# Patient Record
Sex: Male | Born: 1965 | Race: White | Hispanic: No | Marital: Married | Smoking: Never smoker
Health system: Southern US, Community
[De-identification: ages and names within clinical notes are randomized; demographics above are authoritative.]

## PROBLEM LIST (undated history)

## (undated) DIAGNOSIS — G4730 Sleep apnea, unspecified: Secondary | ICD-10-CM

## (undated) DIAGNOSIS — K219 Gastro-esophageal reflux disease without esophagitis: Secondary | ICD-10-CM

## (undated) DIAGNOSIS — R45851 Suicidal ideations: Secondary | ICD-10-CM

## (undated) DIAGNOSIS — F32A Depression, unspecified: Secondary | ICD-10-CM

## (undated) DIAGNOSIS — I10 Essential (primary) hypertension: Secondary | ICD-10-CM

## (undated) DIAGNOSIS — F419 Anxiety disorder, unspecified: Secondary | ICD-10-CM

## (undated) DIAGNOSIS — F329 Major depressive disorder, single episode, unspecified: Secondary | ICD-10-CM

## (undated) DIAGNOSIS — E785 Hyperlipidemia, unspecified: Secondary | ICD-10-CM

## (undated) HISTORY — PX: HERNIA REPAIR: SHX51

## (undated) HISTORY — PX: SHOULDER ARTHROSCOPY: SHX128

---

## 2010-02-10 ENCOUNTER — Ambulatory Visit: Payer: Self-pay | Admitting: Diagnostic Radiology

## 2010-02-10 ENCOUNTER — Emergency Department (HOSPITAL_BASED_OUTPATIENT_CLINIC_OR_DEPARTMENT_OTHER): Admission: EM | Admit: 2010-02-10 | Discharge: 2010-02-10 | Payer: Self-pay | Admitting: Emergency Medicine

## 2010-07-04 LAB — CBC
MCV: 89.4 fL (ref 78.0–100.0)
Platelets: 241 10*3/uL (ref 150–400)
RBC: 4.99 MIL/uL (ref 4.22–5.81)
WBC: 11.2 10*3/uL — ABNORMAL HIGH (ref 4.0–10.5)

## 2010-07-04 LAB — POCT CARDIAC MARKERS
CKMB, poc: 1.2 ng/mL (ref 1.0–8.0)
Myoglobin, poc: 84.7 ng/mL (ref 12–200)
Troponin i, poc: <0.05 ng/mL (ref 0.00–0.09)

## 2010-07-04 LAB — URINALYSIS, ROUTINE W REFLEX MICROSCOPIC
Bilirubin Urine: NEGATIVE
Glucose, UA: NEGATIVE mg/dL
Hgb urine dipstick: NEGATIVE
Ketones, ur: NEGATIVE mg/dL
pH: 6 (ref 5.0–8.0)

## 2010-07-04 LAB — DIFFERENTIAL
Eosinophils Relative: 0 % (ref 0–5)
Lymphocytes Relative: 25 % (ref 12–46)
Lymphs Abs: 2.8 10*3/uL (ref 0.7–4.0)
Neutro Abs: 6.8 10*3/uL (ref 1.7–7.7)
Neutrophils Relative %: 61 % (ref 43–77)

## 2010-07-04 LAB — BASIC METABOLIC PANEL
Chloride: 100 mEq/L (ref 96–112)
Creatinine, Ser: 1 mg/dL (ref 0.4–1.5)
GFR calc Af Amer: >60 mL/min (ref >60)
Potassium: 3.5 mEq/L (ref 3.5–5.1)

## 2011-03-27 ENCOUNTER — Encounter: Payer: Self-pay | Admitting: Family Medicine

## 2011-03-27 ENCOUNTER — Emergency Department (HOSPITAL_BASED_OUTPATIENT_CLINIC_OR_DEPARTMENT_OTHER)
Admission: EM | Admit: 2011-03-27 | Discharge: 2011-03-27 | Disposition: A | Discharge status: Home / Self Care | Payer: 59 | Attending: Emergency Medicine | Admitting: Emergency Medicine

## 2011-03-27 ENCOUNTER — Emergency Department (INDEPENDENT_AMBULATORY_CARE_PROVIDER_SITE_OTHER): Payer: 59

## 2011-03-27 DIAGNOSIS — I10 Essential (primary) hypertension: Secondary | ICD-10-CM | POA: Insufficient documentation

## 2011-03-27 DIAGNOSIS — J189 Pneumonia, unspecified organism: Secondary | ICD-10-CM | POA: Insufficient documentation

## 2011-03-27 DIAGNOSIS — R05 Cough: Secondary | ICD-10-CM

## 2011-03-27 DIAGNOSIS — R059 Cough, unspecified: Secondary | ICD-10-CM

## 2011-03-27 DIAGNOSIS — IMO0001 Reserved for inherently not codable concepts without codable children: Secondary | ICD-10-CM | POA: Insufficient documentation

## 2011-03-27 DIAGNOSIS — R509 Fever, unspecified: Secondary | ICD-10-CM | POA: Insufficient documentation

## 2011-03-27 DIAGNOSIS — K219 Gastro-esophageal reflux disease without esophagitis: Secondary | ICD-10-CM | POA: Insufficient documentation

## 2011-03-27 HISTORY — DX: Essential (primary) hypertension: I10

## 2011-03-27 HISTORY — DX: Gastro-esophageal reflux disease without esophagitis: K21.9

## 2011-03-27 MED ORDER — AZITHROMYCIN 250 MG PO TABS: 250.0000 mg | ORAL_TABLET | Freq: Every day | ORAL | Status: AC

## 2011-03-27 MED ORDER — IBUPROFEN 800 MG PO TABS: 800.0000 mg | ORAL_TABLET | Freq: Three times a day (TID) | ORAL | Status: AC

## 2011-03-27 MED ORDER — AZITHROMYCIN 250 MG PO TABS: 250.0000 mg | ORAL_TABLET | Freq: Every day | ORAL | Status: DC

## 2011-03-27 MED ORDER — IBUPROFEN 800 MG PO TABS
800.0000 mg | ORAL_TABLET | Freq: Once | ORAL | Status: AC
  Administered 2011-03-27: 800 mg via ORAL
  Filled 2011-03-27: qty 1

## 2011-03-27 NOTE — ED Notes (Signed)
Pt c/o fever and vomiting since yesterday with headache.

## 2011-03-27 NOTE — ED Provider Notes (Signed)
History     CSN: [id number] Arrival date & time: 03/27/2011  3:47 PM   First MD Initiated Contact with Patient 03/27/11 1623      Chief Complaint  Patient presents with  . Fever    (Consider location/radiation/quality/duration/timing/severity/associated sxs/prior treatment) Patient is a 45 y.o. male presenting with fever. The history is provided by the patient. No language interpreter was used.  Fever Primary symptoms of the febrile illness include fever, cough and myalgias. Primary symptoms do not include dysuria. The current episode started yesterday. This is a new problem. The problem has not changed since onset. The cough began yesterday. The cough is new. The cough is productive. The sputum is yellow. It is exacerbated by exertion.  Associated with: nothing. Risk factors: nothing. Pt reports he began coughing yesterday.  Pt running a fever today.  (Wife reports pt started coughing up colored phelgm today. )  Pt has not had anything for fever today.  Pt had cough medication last pm.   Past Medical History  Diagnosis Date  . Hypertension   . Acid reflux     Past Surgical History  Procedure Date  . Shoulder arthroscopy   . Hernia repair     No family history on file.  History  Substance Use Topics  . Smoking status: Never Smoker   . Smokeless tobacco: Not on file  . Alcohol Use: No      Review of Systems  Constitutional: Positive for fever.  Respiratory: Positive for cough.   Genitourinary: Negative for dysuria.  Musculoskeletal: Positive for myalgias.  All other systems reviewed and are negative.    Allergies  Penicillins  Home Medications   Current Outpatient Rx  Name Route Sig Dispense Refill  . DEXTROMETHORPHAN POLISTIREX ER 30 MG/5ML PO LQCR Oral Take 60 mg by mouth as needed. For cough     . FLUOXETINE HCL 20 MG PO CAPS Oral Take 20 mg by mouth daily.      Marland Kitchen PROZAC PO Oral Take by mouth.      Marland Kitchen LISINOPRIL PO Oral Take by mouth.      . OMEPRAZOLE  MAGNESIUM 20 MG PO TBEC Oral Take 20 mg by mouth daily.      Marland Kitchen ZOFRAN PO Oral Take 1 tablet by mouth once.      Marland Kitchen PSEUDOEPH-DOXYLAMINE-DM-APAP 30-6.25-15-325 MG PO CAPS Oral Take 2 capsules by mouth every 6 (six) hours as needed. For congestion       BP 124/75  Pulse 86  Temp(Src) 101.6 F (38.7 C) (Oral)  Resp 16  Ht 5\' 8"  (1.727 m)  Wt 201 lb (91.173 kg)  BMI 30.56 kg/m2  SpO2 95%  Physical Exam  Nursing note and vitals reviewed. Constitutional: He is oriented to person, place, and time. He appears well-developed.  HENT:  Head: Normocephalic and atraumatic.  Right Ear: External ear normal.  Left Ear: External ear normal.  Nose: Nose normal.  Mouth/Throat: Oropharynx is clear and moist.  Eyes: Conjunctivae are normal. Pupils are equal, round, and reactive to light.  Neck: Normal range of motion.  Cardiovascular: Normal rate, regular rhythm and normal heart sounds.   Pulmonary/Chest: Effort normal and breath sounds normal.  Abdominal: Soft. Bowel sounds are normal.  Musculoskeletal: Normal range of motion.  Neurological: He is alert and oriented to person, place, and time.  Skin: Skin is warm.  Psychiatric: He has a normal mood and affect.    ED Course  Procedures (including critical care time)  Labs Reviewed -  No data to display Dg Chest 2 View  03/27/2011  *RADIOLOGY REPORT*  Clinical Data: Fever and cough  CHEST - 2 VIEW  Comparison: 02/10/2010  Findings: Heart size is normal.  No pleural effusion identified.  Asymmetric opacity within the left lower lobe is identified and is suspicious for early pneumonia.  The right lung is clear.  Chronic-appearing bronchitic changes are noted.  IMPRESSION:  1.  Subtle asymmetric left lower lobe opacities suspicious for early pneumonia.  Original Report Authenticated By: Rosealee Albee, M.D.     No diagnosis found.    MDM  Symptoms sound viral,  Chest xray obtained because wife mentioned colored sputum.  Pt given ibuprofen.  I  will treat with zithromax and ibuprofen.  This could be influenza however we are not testing for influenza and I will treat opacity as pneumonia.  Pt advised follow up with primary MD for recheck in 2-3 days.      Langston Masker, [place] 03/27/11 1709  Langston Masker, [place] 03/27/11 1758

## 2011-03-27 NOTE — ED Provider Notes (Signed)
Medical screening examination/treatment/procedure(s) were performed by non-physician practitioner and as supervising physician I was immediately available for consultation/collaboration.    Aries Kasa A. Zakeya Junker, MD 03/27/11 2312 

## 2013-10-10 ENCOUNTER — Emergency Department (HOSPITAL_BASED_OUTPATIENT_CLINIC_OR_DEPARTMENT_OTHER)
Admission: EM | Admit: 2013-10-10 | Discharge: 2013-10-10 | Disposition: A | Discharge status: Home / Self Care | Payer: 59 | Attending: Emergency Medicine | Admitting: Emergency Medicine

## 2013-10-10 ENCOUNTER — Encounter (HOSPITAL_BASED_OUTPATIENT_CLINIC_OR_DEPARTMENT_OTHER): Payer: Self-pay | Admitting: Emergency Medicine

## 2013-10-10 ENCOUNTER — Emergency Department (HOSPITAL_BASED_OUTPATIENT_CLINIC_OR_DEPARTMENT_OTHER): Payer: 59

## 2013-10-10 DIAGNOSIS — K219 Gastro-esophageal reflux disease without esophagitis: Secondary | ICD-10-CM | POA: Insufficient documentation

## 2013-10-10 DIAGNOSIS — Z880 Allergy status to penicillin: Secondary | ICD-10-CM | POA: Insufficient documentation

## 2013-10-10 DIAGNOSIS — K5289 Other specified noninfective gastroenteritis and colitis: Secondary | ICD-10-CM | POA: Insufficient documentation

## 2013-10-10 DIAGNOSIS — K529 Noninfective gastroenteritis and colitis, unspecified: Secondary | ICD-10-CM

## 2013-10-10 DIAGNOSIS — Z79899 Other long term (current) drug therapy: Secondary | ICD-10-CM | POA: Insufficient documentation

## 2013-10-10 DIAGNOSIS — Z791 Long term (current) use of non-steroidal anti-inflammatories (NSAID): Secondary | ICD-10-CM | POA: Insufficient documentation

## 2013-10-10 DIAGNOSIS — R110 Nausea: Secondary | ICD-10-CM | POA: Insufficient documentation

## 2013-10-10 DIAGNOSIS — I10 Essential (primary) hypertension: Secondary | ICD-10-CM | POA: Insufficient documentation

## 2013-10-10 DIAGNOSIS — R1032 Left lower quadrant pain: Secondary | ICD-10-CM

## 2013-10-10 LAB — CBC WITH DIFFERENTIAL/PLATELET
BASOS ABS: 0 10*3/uL (ref 0.0–0.1)
Basophils Relative: 0 % (ref 0–1)
EOS PCT: 2 % (ref 0–5)
Eosinophils Absolute: 0.1 10*3/uL (ref 0.0–0.7)
HCT: 44.6 % (ref 39.0–52.0)
Hemoglobin: 15.3 g/dL (ref 13.0–17.0)
LYMPHS PCT: 21 % (ref 12–46)
Lymphs Abs: 1.8 10*3/uL (ref 0.7–4.0)
MCH: 29.2 pg (ref 26.0–34.0)
MCHC: 34.3 g/dL (ref 30.0–36.0)
MCV: 85.1 fL (ref 78.0–100.0)
Monocytes Absolute: 1 10*3/uL (ref 0.1–1.0)
Monocytes Relative: 12 % (ref 3–12)
NEUTROS ABS: 5.6 10*3/uL (ref 1.7–7.7)
NEUTROS PCT: 65 % (ref 43–77)
PLATELETS: 223 10*3/uL (ref 150–400)
RBC: 5.24 MIL/uL (ref 4.22–5.81)
RDW: 13.5 % (ref 11.5–15.5)
WBC: 8.5 10*3/uL (ref 4.0–10.5)

## 2013-10-10 LAB — URINALYSIS, ROUTINE W REFLEX MICROSCOPIC
Bilirubin Urine: NEGATIVE
GLUCOSE, UA: NEGATIVE mg/dL
HGB URINE DIPSTICK: NEGATIVE
Ketones, ur: NEGATIVE mg/dL
Leukocytes, UA: NEGATIVE
Nitrite: NEGATIVE
PROTEIN: NEGATIVE mg/dL
SPECIFIC GRAVITY, URINE: 1.029 (ref 1.005–1.030)
Urobilinogen, UA: 1 mg/dL (ref 0.0–1.0)
pH: 6.5 (ref 5.0–8.0)

## 2013-10-10 LAB — COMPREHENSIVE METABOLIC PANEL
ALK PHOS: 76 U/L (ref 39–117)
ALT: 26 U/L (ref 0–53)
AST: 24 U/L (ref 0–37)
Albumin: 3.8 g/dL (ref 3.5–5.2)
BUN: 21 mg/dL (ref 6–23)
CALCIUM: 8.8 mg/dL (ref 8.4–10.5)
CO2: 28 meq/L (ref 19–32)
Chloride: 104 mEq/L (ref 96–112)
Creatinine, Ser: 0.9 mg/dL (ref 0.50–1.35)
GFR calc Af Amer: >90 mL/min (ref >90)
Glucose, Bld: 120 mg/dL — ABNORMAL HIGH (ref 70–99)
POTASSIUM: 3.3 meq/L — AB (ref 3.7–5.3)
SODIUM: 144 meq/L (ref 137–147)
Total Bilirubin: 0.4 mg/dL (ref 0.3–1.2)
Total Protein: 7.1 g/dL (ref 6.0–8.3)

## 2013-10-10 LAB — LIPASE, BLOOD: Lipase: 21 U/L (ref 11–59)

## 2013-10-10 MED ORDER — ONDANSETRON 8 MG PO TBDP: ORAL_TABLET | ORAL | Status: DC

## 2013-10-10 MED ORDER — IOHEXOL 300 MG/ML  SOLN
100.0000 mL | Freq: Once | INTRAMUSCULAR | Status: AC | PRN
  Administered 2013-10-10: 100 mL via INTRAVENOUS

## 2013-10-10 MED ORDER — MORPHINE SULFATE 4 MG/ML IJ SOLN
4.0000 mg | Freq: Once | INTRAMUSCULAR | Status: AC
  Administered 2013-10-10: 4 mg via INTRAVENOUS
  Filled 2013-10-10: qty 1

## 2013-10-10 MED ORDER — SODIUM CHLORIDE 0.9 % IV BOLUS (SEPSIS)
1000.0000 mL | Freq: Once | INTRAVENOUS | Status: AC
  Administered 2013-10-10: 1000 mL via INTRAVENOUS

## 2013-10-10 MED ORDER — ONDANSETRON HCL 4 MG/2ML IJ SOLN
4.0000 mg | Freq: Once | INTRAMUSCULAR | Status: AC
  Administered 2013-10-10: 4 mg via INTRAVENOUS
  Filled 2013-10-10: qty 2

## 2013-10-10 MED ORDER — KETOROLAC TROMETHAMINE 30 MG/ML IJ SOLN
30.0000 mg | Freq: Once | INTRAMUSCULAR | Status: AC
  Administered 2013-10-10: 30 mg via INTRAVENOUS
  Filled 2013-10-10: qty 1

## 2013-10-10 MED ORDER — IOHEXOL 300 MG/ML  SOLN
50.0000 mL | Freq: Once | INTRAMUSCULAR | Status: AC | PRN
  Administered 2013-10-10: 50 mL via ORAL

## 2013-10-10 NOTE — ED Notes (Signed)
Patient transported to CT 

## 2013-10-10 NOTE — Discharge Instructions (Signed)
Zofran as prescribed as needed for nausea.  Return to the emergency department if you develop severe pain, bloody stool, high fever, or other new and concerning symptoms.   Viral Gastroenteritis Viral gastroenteritis is also known as stomach flu. This condition affects the stomach and intestinal tract. It can cause sudden diarrhea and vomiting. The illness typically lasts 3 to 8 days. Most people develop an immune response that eventually gets rid of the virus. While this natural response develops, the virus can make you quite ill. CAUSES  Many different viruses can cause gastroenteritis, such as rotavirus or noroviruses. You can catch one of these viruses by consuming contaminated food or water. You may also catch a virus by sharing utensils or other personal items with an infected person or by touching a contaminated surface. SYMPTOMS  The most common symptoms are diarrhea and vomiting. These problems can cause a severe loss of body fluids (dehydration) and a body salt (electrolyte) imbalance. Other symptoms may include:  Fever.  Headache.  Fatigue.  Abdominal pain. DIAGNOSIS  Your caregiver can usually diagnose viral gastroenteritis based on your symptoms and a physical exam. A stool sample may also be taken to test for the presence of viruses or other infections. TREATMENT  This illness typically goes away on its own. Treatments are aimed at rehydration. The most serious cases of viral gastroenteritis involve vomiting so severely that you are not able to keep fluids down. In these cases, fluids must be given through an intravenous line (IV). HOME CARE INSTRUCTIONS   Drink enough fluids to keep your urine clear or pale yellow. Drink small amounts of fluids frequently and increase the amounts as tolerated.  Ask your caregiver for specific rehydration instructions.  Avoid:  Foods high in sugar.  Alcohol.  Carbonated drinks.  Tobacco.  Juice.  Caffeine drinks.  Extremely  hot or cold fluids.  Fatty, greasy foods.  Too much intake of anything at one time.  Dairy products until 24 to 48 hours after diarrhea stops.  You may consume probiotics. Probiotics are active cultures of beneficial bacteria. They may lessen the amount and number of diarrheal stools in adults. Probiotics can be found in yogurt with active cultures and in supplements.  Wash your hands well to avoid spreading the virus.  Only take over-the-counter or prescription medicines for pain, discomfort, or fever as directed by your caregiver. Do not give aspirin to children. Antidiarrheal medicines are not recommended.  Ask your caregiver if you should continue to take your regular prescribed and over-the-counter medicines.  Keep all follow-up appointments as directed by your caregiver. SEEK IMMEDIATE MEDICAL CARE IF:   You are unable to keep fluids down.  You do not urinate at least once every 6 to 8 hours.  You develop shortness of breath.  You notice blood in your stool or vomit. This may look like coffee grounds.  You have abdominal pain that increases or is concentrated in one small area (localized).  You have persistent vomiting or diarrhea.  You have a fever.  The patient is a child younger than 3 months, and he or she has a fever.  The patient is a child older than 3 months, and he or she has a fever and persistent symptoms.  The patient is a child older than 3 months, and he or she has a fever and symptoms suddenly get worse.  The patient is a baby, and he or she has no tears when crying. MAKE SURE YOU:   Understand these  instructions.  Will watch your condition.  Will get help right away if you are not doing well or get worse. Document Released: 04/08/2005 Document Revised: 07/01/2011 Document Reviewed: 01/23/2011 [facility] Patient Information 2015 [place], [place]. This information is not intended to replace advice given to you by your health care provider. Make sure  you discuss any questions you have with your health care provider.

## 2013-10-10 NOTE — ED Notes (Signed)
Pt's BP high due to not being able to take medications the last few days. MD aware. Waiting on CT results.

## 2013-10-10 NOTE — ED Provider Notes (Signed)
CSN: [id number]     Arrival date & time 10/10/13  0719 History   First MD Initiated Contact with Patient 10/10/13 [phone]     Chief Complaint  Patient presents with  . Abdominal Pain     (Consider location/radiation/quality/duration/timing/severity/associated sxs/prior Treatment) HPI Comments: Patient is a 48 year old male with past medical history of acid reflux. He presents with complaints of nausea, vomiting, and foul-smelling diarrhea which has been worsening over the past 3 days. He denies any fevers or chills. He denies any blood in his stool or vomit. He reports his son was ill in a similar fashion last week.  Patient is a 48 y.o. male presenting with abdominal pain. The history is provided by the patient.  Abdominal Pain Pain location:  LUQ and LLQ Pain quality: cramping   Pain radiates to:  Does not radiate Pain severity:  Moderate Onset quality:  Gradual Duration:  3 days Timing:  Constant Progression:  Worsening Chronicity:  New Relieved by:  Nothing Worsened by:  Nothing tried Ineffective treatments:  None tried Associated symptoms: diarrhea, nausea and vomiting   Associated symptoms: no fever     Past Medical History  Diagnosis Date  . Hypertension   . Acid reflux    Past Surgical History  Procedure Laterality Date  . Shoulder arthroscopy    . Hernia repair      2002   No family history on file. History  Substance Use Topics  . Smoking status: Never Smoker   . Smokeless tobacco: Not on file  . Alcohol Use: No    Review of Systems  Constitutional: Negative for fever.  Gastrointestinal: Positive for nausea, vomiting, abdominal pain and diarrhea.  All other systems reviewed and are negative.     Allergies  Penicillins  Home Medications   Prior to Admission medications   Medication Sig Start Date End Date Taking? Authorizing Provider  naproxen sodium (ANAPROX) 220 MG tablet Take 220 mg by mouth 2 (two) times daily with a meal.   Yes Historical  Provider, MD  dextromethorphan (DELSYM) 30 MG/5ML liquid Take 60 mg by mouth as needed. For cough     Historical Provider, MD  FLUoxetine (PROZAC) 20 MG capsule Take 20 mg by mouth daily.      Historical Provider, MD  lisinopril (PRINIVIL,ZESTRIL) 20 MG tablet Take 20 mg by mouth daily.      Historical Provider, MD  omeprazole (PRILOSEC OTC) 20 MG tablet Take 20 mg by mouth daily.      Historical Provider, MD  Ondansetron HCl (ZOFRAN PO) Take 1 tablet by mouth once.      Historical Provider, MD  Pseudoeph-Doxylamine-DM-APAP 30-6.25-15-325 MG CAPS Take 2 capsules by mouth every 6 (six) hours as needed. For congestion     Historical Provider, MD   BP 162/104  Pulse 63  Temp(Src) 97.9 F (36.6 C) (Oral)  Resp 18  Ht 5\' 7"  (1.702 m)  Wt 205 lb (92.987 kg)  BMI 32.10 kg/m2  SpO2 95% Physical Exam  Nursing note and vitals reviewed. Constitutional: He is oriented to person, place, and time. He appears well-developed and well-nourished. No distress.  HENT:  Head: Normocephalic and atraumatic.  Mouth/Throat: Oropharynx is clear and moist.  Neck: Normal range of motion. Neck supple.  Cardiovascular: Normal rate, regular rhythm and normal heart sounds.   No murmur heard. Pulmonary/Chest: Effort normal and breath sounds normal. No respiratory distress. He has no wheezes.  Abdominal: Soft. Bowel sounds are normal. He exhibits no distension. There is  tenderness.  There is tenderness to palpation in the left upper and left lower quadrants. There is no rebound and no guarding.  Musculoskeletal: Normal range of motion. He exhibits no edema.  Neurological: He is alert and oriented to person, place, and time.  Skin: Skin is warm and dry. He is not diaphoretic.    ED Course  Procedures (including critical care time) Labs Review Labs Reviewed - No data to display  Imaging Review No results found.   EKG Interpretation None      MDM   Final diagnoses:  None    Patient presents here with  nausea, vomiting, diarrhea, and generalized abdominal pain with significant tenderness to palpation in the left abdomen.  Workup reveals no elevation of white count, unremarkable electrolytes and CT scan that does not reveal an emergent process. He is feeling better with fluids and medications in the ER and appears appropriate for home. His son had a similar illness earlier on in the week and I suspect a viral etiology. She will be discharged and understands to return if his symptoms substantially worsen or change.    Veryl Speak, MD 10/10/13 1009

## 2013-10-10 NOTE — ED Notes (Signed)
Family at bedside. Pt given contrast to drink and instructions. Pt resting comfortable on stretcher.

## 2013-10-10 NOTE — ED Notes (Signed)
MD at bedside. 

## 2013-10-10 NOTE — ED Notes (Signed)
N/V/D since Thursday. Abd pain in central generalized area, states it radiates into his lower back.

## 2013-10-26 ENCOUNTER — Emergency Department (HOSPITAL_BASED_OUTPATIENT_CLINIC_OR_DEPARTMENT_OTHER)
Admission: EM | Admit: 2013-10-26 | Discharge: 2013-10-26 | Disposition: A | Discharge status: Home / Self Care | Payer: 59 | Attending: Emergency Medicine | Admitting: Emergency Medicine

## 2013-10-26 ENCOUNTER — Encounter (HOSPITAL_BASED_OUTPATIENT_CLINIC_OR_DEPARTMENT_OTHER): Payer: Self-pay | Admitting: Emergency Medicine

## 2013-10-26 ENCOUNTER — Emergency Department (HOSPITAL_BASED_OUTPATIENT_CLINIC_OR_DEPARTMENT_OTHER): Payer: 59

## 2013-10-26 DIAGNOSIS — K219 Gastro-esophageal reflux disease without esophagitis: Secondary | ICD-10-CM | POA: Insufficient documentation

## 2013-10-26 DIAGNOSIS — K5289 Other specified noninfective gastroenteritis and colitis: Secondary | ICD-10-CM | POA: Insufficient documentation

## 2013-10-26 DIAGNOSIS — Z9889 Other specified postprocedural states: Secondary | ICD-10-CM | POA: Insufficient documentation

## 2013-10-26 DIAGNOSIS — R109 Unspecified abdominal pain: Secondary | ICD-10-CM | POA: Diagnosis present

## 2013-10-26 DIAGNOSIS — Z791 Long term (current) use of non-steroidal anti-inflammatories (NSAID): Secondary | ICD-10-CM | POA: Diagnosis not present

## 2013-10-26 DIAGNOSIS — I10 Essential (primary) hypertension: Secondary | ICD-10-CM | POA: Insufficient documentation

## 2013-10-26 DIAGNOSIS — Z79899 Other long term (current) drug therapy: Secondary | ICD-10-CM | POA: Insufficient documentation

## 2013-10-26 DIAGNOSIS — Z880 Allergy status to penicillin: Secondary | ICD-10-CM | POA: Diagnosis not present

## 2013-10-26 DIAGNOSIS — K529 Noninfective gastroenteritis and colitis, unspecified: Secondary | ICD-10-CM

## 2013-10-26 LAB — CBC WITH DIFFERENTIAL/PLATELET
BASOS ABS: 0 10*3/uL (ref 0.0–0.1)
Basophils Relative: 0 % (ref 0–1)
Eosinophils Absolute: 0.1 10*3/uL (ref 0.0–0.7)
Eosinophils Relative: 1 % (ref 0–5)
HEMATOCRIT: 48.6 % (ref 39.0–52.0)
HEMOGLOBIN: 16.4 g/dL (ref 13.0–17.0)
LYMPHS PCT: 30 % (ref 12–46)
Lymphs Abs: 2.6 10*3/uL (ref 0.7–4.0)
MCH: 28.9 pg (ref 26.0–34.0)
MCHC: 33.7 g/dL (ref 30.0–36.0)
MCV: 85.6 fL (ref 78.0–100.0)
MONO ABS: 0.8 10*3/uL (ref 0.1–1.0)
MONOS PCT: 9 % (ref 3–12)
NEUTROS ABS: 5.3 10*3/uL (ref 1.7–7.7)
Neutrophils Relative %: 60 % (ref 43–77)
Platelets: 289 10*3/uL (ref 150–400)
RBC: 5.68 MIL/uL (ref 4.22–5.81)
RDW: 13.3 % (ref 11.5–15.5)
WBC: 8.7 10*3/uL (ref 4.0–10.5)

## 2013-10-26 LAB — HEPATIC FUNCTION PANEL
ALBUMIN: 4.2 g/dL (ref 3.5–5.2)
ALT: 24 U/L (ref 0–53)
AST: 20 U/L (ref 0–37)
Alkaline Phosphatase: 87 U/L (ref 39–117)
Bilirubin, Direct: <0.2 mg/dL (ref 0.0–0.3)
TOTAL PROTEIN: 7.8 g/dL (ref 6.0–8.3)
Total Bilirubin: 0.3 mg/dL (ref 0.3–1.2)

## 2013-10-26 LAB — BASIC METABOLIC PANEL
ANION GAP: 15 (ref 5–15)
BUN: 23 mg/dL (ref 6–23)
CHLORIDE: 104 meq/L (ref 96–112)
CO2: 24 meq/L (ref 19–32)
Calcium: 9.6 mg/dL (ref 8.4–10.5)
Creatinine, Ser: 1.1 mg/dL (ref 0.50–1.35)
GFR calc Af Amer: 89 mL/min — ABNORMAL LOW (ref >90)
GFR calc non Af Amer: 77 mL/min — ABNORMAL LOW (ref >90)
Glucose, Bld: 106 mg/dL — ABNORMAL HIGH (ref 70–99)
Potassium: 3.6 mEq/L — ABNORMAL LOW (ref 3.7–5.3)
Sodium: 143 mEq/L (ref 137–147)

## 2013-10-26 LAB — URINALYSIS, ROUTINE W REFLEX MICROSCOPIC
Bilirubin Urine: NEGATIVE
GLUCOSE, UA: NEGATIVE mg/dL
Hgb urine dipstick: NEGATIVE
Ketones, ur: NEGATIVE mg/dL
LEUKOCYTES UA: NEGATIVE
Nitrite: NEGATIVE
PH: 5.5 (ref 5.0–8.0)
Protein, ur: NEGATIVE mg/dL
Specific Gravity, Urine: 1.026 (ref 1.005–1.030)
Urobilinogen, UA: 0.2 mg/dL (ref 0.0–1.0)

## 2013-10-26 MED ORDER — LOPERAMIDE HCL 2 MG PO CAPS
4.0000 mg | ORAL_CAPSULE | Freq: Once | ORAL | Status: AC
  Administered 2013-10-26: 4 mg via ORAL
  Filled 2013-10-26: qty 2

## 2013-10-26 MED ORDER — ONDANSETRON HCL 4 MG/2ML IJ SOLN
4.0000 mg | Freq: Once | INTRAMUSCULAR | Status: AC
  Administered 2013-10-26: 4 mg via INTRAVENOUS
  Filled 2013-10-26: qty 2

## 2013-10-26 MED ORDER — MORPHINE SULFATE 4 MG/ML IJ SOLN
4.0000 mg | Freq: Once | INTRAMUSCULAR | Status: AC
  Administered 2013-10-26: 4 mg via INTRAVENOUS
  Filled 2013-10-26: qty 1

## 2013-10-26 MED ORDER — METRONIDAZOLE 500 MG PO TABS
500.0000 mg | ORAL_TABLET | Freq: Once | ORAL | Status: AC
  Administered 2013-10-26: 500 mg via ORAL
  Filled 2013-10-26: qty 1

## 2013-10-26 MED ORDER — CIPROFLOXACIN HCL 500 MG PO TABS: 500.0000 mg | ORAL_TABLET | Freq: Two times a day (BID) | ORAL | Status: DC

## 2013-10-26 MED ORDER — IOHEXOL 300 MG/ML  SOLN
50.0000 mL | Freq: Once | INTRAMUSCULAR | Status: AC | PRN
  Administered 2013-10-26: 50 mL via ORAL

## 2013-10-26 MED ORDER — OXYCODONE-ACETAMINOPHEN 5-325 MG PO TABS: 1.0000 | ORAL_TABLET | Freq: Four times a day (QID) | ORAL | Status: DC | PRN

## 2013-10-26 MED ORDER — METRONIDAZOLE 500 MG PO TABS: 500.0000 mg | ORAL_TABLET | Freq: Three times a day (TID) | ORAL | Status: DC

## 2013-10-26 MED ORDER — IOHEXOL 300 MG/ML  SOLN
100.0000 mL | Freq: Once | INTRAMUSCULAR | Status: AC | PRN
  Administered 2013-10-26: 100 mL via INTRAVENOUS

## 2013-10-26 MED ORDER — SODIUM CHLORIDE 0.9 % IV BOLUS (SEPSIS)
1000.0000 mL | Freq: Once | INTRAVENOUS | Status: AC
Start: 2013-10-26 — End: 2013-10-26
  Administered 2013-10-26: 1000 mL via INTRAVENOUS

## 2013-10-26 MED ORDER — HYDROMORPHONE HCL PF 1 MG/ML IJ SOLN
1.0000 mg | Freq: Once | INTRAMUSCULAR | Status: AC
  Administered 2013-10-26: 1 mg via INTRAVENOUS
  Filled 2013-10-26: qty 1

## 2013-10-26 MED ORDER — ONDANSETRON 4 MG PO TBDP: 4.0000 mg | ORAL_TABLET | Freq: Three times a day (TID) | ORAL | Status: DC | PRN

## 2013-10-26 MED ORDER — CIPROFLOXACIN HCL 500 MG PO TABS
500.0000 mg | ORAL_TABLET | Freq: Once | ORAL | Status: AC
  Administered 2013-10-26: 500 mg via ORAL
  Filled 2013-10-26: qty 1

## 2013-10-26 NOTE — ED Notes (Signed)
Patient transported to CT 

## 2013-10-26 NOTE — ED Notes (Signed)
Reports recent dx with gastroenteritis.  Sts he feels same pain now. Pain to lower abdomen. Diarrhea. Nausea no vomiting.

## 2013-10-26 NOTE — ED Provider Notes (Signed)
CSN: [id number]     Arrival date & time 10/26/13  1417 History   First MD Initiated Contact with Patient 10/26/13 1501     Chief Complaint  Patient presents with  . Abdominal Pain     (Consider location/radiation/quality/duration/timing/severity/associated sxs/prior Treatment) HPI  This is a 48 year old with a history of hypertension who presents with abdominal pain and diarrhea. Patient was seen and evaluated for similar symptoms approximately 2 weeks ago. Patient reports that he followed up with his primary care physician and was given an antibiotic which he thinks was a cephalosporin. His symptoms are much better last week and he was able to work. Symptoms returned on "Sunday. He reports sharp left sided abdominal pain that is nonradiating. The pain is constant but the intensity waxes and wanes. Currently his pain is 7/10. Nothing makes it better or worse. He reports associated nausea and diarrhea. The diarrhea is nonbloody. He states that he's going to the restroom approximately once an hour.  He denies any recent fevers or sick contacts.  Past Medical History  Diagnosis Date  . Hypertension   . Acid reflux    Past Surgical History  Procedure Laterality Date  . Shoulder arthroscopy    . Hernia repair      20" 02   No family history on file. History  Substance Use Topics  . Smoking status: Never Smoker   . Smokeless tobacco: Not on file  . Alcohol Use: No    Review of Systems  Constitutional: Negative.  Negative for fever.  Respiratory: Negative.  Negative for chest tightness and shortness of breath.   Cardiovascular: Negative.  Negative for chest pain.  Gastrointestinal: Positive for nausea, abdominal pain and diarrhea. Negative for vomiting and blood in stool.  Genitourinary: Negative.  Negative for dysuria and urgency.  Musculoskeletal: Negative for back pain.  Neurological: Negative for headaches.  All other systems reviewed and are negative.     Allergies   Penicillins  Home Medications   Prior to Admission medications   Medication Sig Start Date End Date Taking? Authorizing Provider  clonazePAM (KLONOPIN) 0.5 MG tablet Take 0.5 mg by mouth 2 (two) times daily as needed for anxiety.   Yes Historical Provider, MD  ciprofloxacin (CIPRO) 500 MG tablet Take 1 tablet (500 mg total) by mouth 2 (two) times daily. 10/26/13   Merryl Hacker, MD  dextromethorphan (DELSYM) 30 MG/5ML liquid Take 60 mg by mouth as needed. For cough     Historical Provider, MD  FLUoxetine (PROZAC) 20 MG capsule Take 20 mg by mouth daily.      Historical Provider, MD  lisinopril (PRINIVIL,ZESTRIL) 20 MG tablet Take 20 mg by mouth daily.      Historical Provider, MD  metroNIDAZOLE (FLAGYL) 500 MG tablet Take 1 tablet (500 mg total) by mouth 3 (three) times daily. 10/26/13   Merryl Hacker, MD  naproxen sodium (ANAPROX) 220 MG tablet Take 220 mg by mouth 2 (two) times daily with a meal.    Historical Provider, MD  omeprazole (PRILOSEC OTC) 20 MG tablet Take 20 mg by mouth daily.      Historical Provider, MD  ondansetron (ZOFRAN ODT) 4 MG disintegrating tablet Take 1 tablet (4 mg total) by mouth every 8 (eight) hours as needed for nausea or vomiting. 10/26/13   Merryl Hacker, MD  ondansetron (ZOFRAN ODT) 8 MG disintegrating tablet 8mg  ODT q4 hours prn nausea 10/10/13   Veryl Speak, MD  Ondansetron HCl (ZOFRAN PO) Take 1 tablet by  mouth once.      Historical Provider, MD  oxyCODONE-acetaminophen (PERCOCET/ROXICET) 5-325 MG per tablet Take 1 tablet by mouth every 6 (six) hours as needed for moderate pain or severe pain. 10/26/13   Merryl Hacker, MD  Pseudoeph-Doxylamine-DM-APAP 30-6.25-15-325 MG CAPS Take 2 capsules by mouth every 6 (six) hours as needed. For congestion     Historical Provider, MD   BP 150/88  Pulse 46  Temp(Src) 98.8 F (37.1 C) (Oral)  Resp 16  Ht 5\' 7"  (1.702 m)  Wt 200 lb (90.719 kg)  BMI 31.32 kg/m2  SpO2 100% Physical Exam  Nursing note and  vitals reviewed. Constitutional: He is oriented to person, place, and time. He appears well-developed and well-nourished. No distress.  HENT:  Head: Normocephalic and atraumatic.  Mouth/Throat: Oropharynx is clear and moist.  Cardiovascular: Normal rate, regular rhythm and normal heart sounds.   No murmur heard. Pulmonary/Chest: Effort normal and breath sounds normal. No respiratory distress. He has no wheezes.  Abdominal: Soft. Bowel sounds are normal. There is tenderness. There is no rebound.  Left upper and lower quadrant tenderness to palpation without rebound or guarding  Musculoskeletal: He exhibits no edema.  Lymphadenopathy:    He has no cervical adenopathy.  Neurological: He is alert and oriented to person, place, and time.  Skin: Skin is warm and dry.  Psychiatric: He has a normal mood and affect.    ED Course  Procedures (including critical care time) Labs Review Labs Reviewed  BASIC METABOLIC PANEL - Abnormal; Notable for the following:    Potassium 3.6 (*)    Glucose, Bld 106 (*)    GFR calc non Af Amer 77 (*)    GFR calc Af Amer 89 (*)    All other components within normal limits  CLOSTRIDIUM DIFFICILE BY PCR  URINALYSIS, ROUTINE W REFLEX MICROSCOPIC  CBC WITH DIFFERENTIAL  HEPATIC FUNCTION PANEL    Imaging Review Dg Abd 1 View  10/26/2013   CLINICAL DATA:  Abdominal pain and diarrhea  EXAM: ABDOMEN - 1 VIEW  COMPARISON:  CT abdomen and pelvis October 10, 2013  FINDINGS: The bowel gas pattern is unremarkable. No obstruction or free air is seen on this supine examination. There is a fairly mild degree of stool in the colon. There are apparent small phleboliths in the pelvis.  IMPRESSION: Bowel gas pattern is overall unremarkable.   Electronically Signed   By: Lowella Grip M.D.   On: 10/26/2013 15:38   Ct Abdomen Pelvis W Contrast  10/26/2013   CLINICAL DATA:  Left lower quadrant abdominal pain and diarrhea.  EXAM: CT ABDOMEN AND PELVIS WITH CONTRAST  TECHNIQUE:  Multidetector CT imaging of the abdomen and pelvis was performed using the standard protocol following bolus administration of intravenous contrast.  CONTRAST:  7mL OMNIPAQUE IOHEXOL 300 MG/ML SOLN, 143mL OMNIPAQUE IOHEXOL 300 MG/ML SOLN  COMPARISON:  10/10/2013.  FINDINGS: The lung bases are clear.  The heart is normal in size.  The solid abdominal organs are unremarkable and stable. No acute inflammatory process or mass lesions. The gallbladder is normal. No common bile duct dilatation.  The stomach is distended with contrast. No gross abnormalities are seen. The duodenum, small bowel and colon are unremarkable. No inflammatory changes, mass lesions or obstructive findings. As opposed to form stool there appears to be fluid throughout the colon which can be seen with diarrhea. I do not see any colonic wall thickening or pericolonic inflammatory changes. The appendix is normal. The terminal ileum  is normal.  No mesenteric or retroperitoneal mass or adenopathy. Small scattered lymph nodes are stable. The aorta and branch vessels are normal. The major venous structures are patent.  The bladder, prostate gland and seminal vesicles are unremarkable. No pelvic mass or adenopathy. No free pelvic fluid collections. There is a small amount of fluid in the right inguinal canal.  IMPRESSION: Moderate fluid throughout the colon correlating with the patient's diarrhea. I do not see any obvious colonic inflammation/colitis.  The small bowel, terminal ileum and appendix are normal.  The solid abdominal organs are normal and stable.   Electronically Signed   By: Kalman Jewels M.D.   On: 10/26/2013 18:43     EKG Interpretation None      MDM   Final diagnoses:  Colitis    Patient presents with diarrhea and abdominal pain he is nontoxic on exam and vital signs are reassuring. Patient was given fluids and pain medication. Initial lab work is reassuring as well as KUB.  On repeat evaluation, patient continues to  complain of abdominal pain. He is more tender to palpation and states that his pain is worse when a bed. Patient did recently have a CT scan; however, worried given patient's persistent pain and possible signs are tinnitus at this time. Repeat CT scan ordered. Patient was given Cipro and Flagyl if your weight. C. difficile was also sent given recent reported cyclosporine use.  CT scan is reassuring. No obvious evidence of diverticulitis or colitis. Patient was given Imodium. Discuss with patient plan to discharge on Cipro and Flagyl empirically. Patient also given pain and nausea medicines.  Patient stated understanding. He is to followup with his primary care physician if symptoms persist. He was given strict return precautions including worsening pain, fevers, or any other worsening of symptoms.  After history, exam, and medical workup I feel the patient has been appropriately medically screened and is safe for discharge home. Pertinent diagnoses were discussed with the patient. Patient was given return precautions.    Merryl Hacker, MD 10/26/13 [phone]

## 2013-10-26 NOTE — ED Notes (Signed)
Pt complained of pain at his IV site. No redness, no swelling, blood withdrawns easily from IV port and flushes easily. Offered to move IV site. 1000 NS set up and attached to IV site. Wife states she is concerned that the end of the tubing hit the floor prior to plugging it into the IV site. End of IV tubing did not hit the floor the roller clamp on the middle length of the tubing hit the floor and made a click noise. I held the end of the tubing as the fluid flushed through. She admitted she never saw it hit she just heard a noise.

## 2013-10-26 NOTE — Discharge Instructions (Signed)

## 2013-10-26 NOTE — ED Notes (Signed)
Patient returned from CT

## 2013-10-26 NOTE — ED Notes (Signed)
Wife states he has been taking Imodium at home for the past 2 days with no relief.

## 2013-10-27 LAB — CLOSTRIDIUM DIFFICILE BY PCR: Toxigenic C. Difficile by PCR: NEGATIVE

## 2013-12-16 ENCOUNTER — Encounter: Payer: Self-pay | Admitting: Medical

## 2013-12-16 ENCOUNTER — Ambulatory Visit (INDEPENDENT_AMBULATORY_CARE_PROVIDER_SITE_OTHER): Payer: 59 | Admitting: Medical

## 2013-12-16 VITALS — BP 140/90 | HR 60 | Temp 98.2°F | Ht 68.5 in | Wt 200.0 lb

## 2013-12-16 DIAGNOSIS — R5383 Other fatigue: Principal | ICD-10-CM

## 2013-12-16 DIAGNOSIS — F411 Generalized anxiety disorder: Secondary | ICD-10-CM | POA: Insufficient documentation

## 2013-12-16 DIAGNOSIS — E876 Hypokalemia: Secondary | ICD-10-CM

## 2013-12-16 DIAGNOSIS — K219 Gastro-esophageal reflux disease without esophagitis: Secondary | ICD-10-CM | POA: Insufficient documentation

## 2013-12-16 DIAGNOSIS — R5381 Other malaise: Secondary | ICD-10-CM

## 2013-12-16 DIAGNOSIS — F3289 Other specified depressive episodes: Secondary | ICD-10-CM

## 2013-12-16 DIAGNOSIS — F32A Depression, unspecified: Secondary | ICD-10-CM

## 2013-12-16 DIAGNOSIS — I10 Essential (primary) hypertension: Secondary | ICD-10-CM | POA: Insufficient documentation

## 2013-12-16 DIAGNOSIS — F329 Major depressive disorder, single episode, unspecified: Secondary | ICD-10-CM

## 2013-12-16 LAB — BASIC METABOLIC PANEL
BUN: 24 mg/dL — AB (ref 6–23)
CALCIUM: 9 mg/dL (ref 8.4–10.5)
CHLORIDE: 107 meq/L (ref 96–112)
CO2: 28 meq/L (ref 19–32)
CREATININE: 0.9 mg/dL (ref 0.4–1.5)
GFR: 101.52 mL/min (ref >60.00)
GLUCOSE: 89 mg/dL (ref 70–99)
POTASSIUM: 3.6 meq/L (ref 3.5–5.1)
SODIUM: 140 meq/L (ref 135–145)

## 2013-12-16 LAB — TSH: TSH: 1.36 u[IU]/mL (ref 0.35–4.50)

## 2013-12-16 MED ORDER — CLONAZEPAM 0.5 MG PO TABS: 0.5000 mg | ORAL_TABLET | Freq: Two times a day (BID) | ORAL | Status: DC | PRN

## 2013-12-16 MED ORDER — LISINOPRIL 20 MG PO TABS: 20.0000 mg | ORAL_TABLET | Freq: Every day | ORAL | Status: DC

## 2013-12-16 MED ORDER — SERTRALINE HCL 50 MG PO TABS: 50.0000 mg | ORAL_TABLET | Freq: Every day | ORAL | Status: DC

## 2013-12-16 NOTE — Assessment & Plan Note (Signed)
Continue periodic use of prilosec. I don't think needs endoscopy/egd based on his described use. When he is referred for screening colonoscopy(near future)Gi can review gerd history.

## 2013-12-16 NOTE — Assessment & Plan Note (Signed)
Previously did well on ssri prozac. Recently did not do well on paxil. I discussed with him could try sertraline. Explained it is in ssri class(since did well with prozac may do well with sertraline). If within 2 wks not improved could consider switching to newer type agent.

## 2013-12-16 NOTE — Patient Instructions (Signed)
For your depression and anxiety I will prescribe sertraline. In addition will refill you klonopin.(Please use sparingly as discussed.) For your htn I am refilling your lisinopril.Please do labs to check your thyroid to evaluate your fatigue. Also we will check your potassium which was mild low on last labs in ED. Follow up in 1 month or as needed. But call us in 2 wks to update [place] on how doing with sertraline as we could increase dose of sertraline or possibly use newer medication if you feel no improvement at all.

## 2013-12-16 NOTE — Assessment & Plan Note (Signed)
Controlled with recent good readings. Today not on lisinopril for 2 days. Refilled his medication today.

## 2013-12-16 NOTE — Progress Notes (Signed)
   Subjective:    Patient ID: Troy Stuart, male    DOB: 07/20/1965, 48 y.o.   MRN: [id number]  HPI  Pt in 1st time. See history. He has 2 children in college. He drinks coffee couple of times a week. No sodas. No exercise. Pt works for [employer]. Moves [employer]. Pt walks rarely periodically. Pt for recreation [employer].  Pt has some various conditions. Pt is out of htn med, and clonopin. He states he is depressed.   Regarding depression he is on paxil. Only on a month. He states on low dose. He was previously on prozac. He stopped taking that after 2 years of use.(Did well and stopped because he wanted to be off med. Then he was off prozac for 2 years then he noticed depression last 3 months. Then was started on paxil one month ago. Pt presenty fatigued. Loss of interest in things he usually enjoys. Crying at times no reason. Low motivation. He states paxil not helping at all. Pt labs in July were relatively normal in July. Done for colitis. Symptoms colitis are cleared up. No report of homicidal or suicidal ideations.  Pt out of bp med for 2 days. He has been on lisinoprill 20 mg for a year. He checks bp Usually 120/70.   Anxiety. This has just started last couple of months. This coincided with depression. Sometimes occurs randomly. He states random severe. 30 tabs last about 5-6 weeks.   Pt gerd is controlled. He states had for 10 yrs. Controlled with otc meds. Takes omprazole 20 mg q daly. No history of endoscopy. He take med about every 3rd day.     Review of Systems  Constitutional: Positive for fatigue. Negative for fever and chills.  HENT: Negative.   Respiratory: Negative for cough, choking and wheezing.   Cardiovascular: Negative for chest pain and palpitations.  Gastrointestinal: Negative.        Gerd controlled.  Genitourinary: Negative.   Musculoskeletal: Negative.   Neurological: Negative.  Negative for dizziness, syncope, facial asymmetry, speech difficulty,  weakness, light-headedness, numbness and headaches.  Hematological: Negative for adenopathy. Does not bruise/bleed easily.  Psychiatric/Behavioral: Positive for dysphoric mood. Negative for suicidal ideas, confusion, sleep disturbance and agitation. The patient is nervous/anxious.        Fatigue some and decreased motivation.       Objective:   Physical Exam  General Mental Status- Alert. General Appearance- Not in acute distress.   Skin General: Color- Normal Color. Moisture- Normal Moisture.  Neck Carotid Arteries- Normal color. Moisture- Normal Moisture. No carotid bruits. No JVD.  Chest and Lung Exam Auscultation: Breath Sounds:-Normal, clear evan and unlabored.  Cardiovascular Auscultation:Rythm- Regular. Murmurs & Other Heart Sounds:Auscultation of the heart reveals- No Murmurs.  Abdomen Inspection:-Inspeection Normal. Palpation/Percussion:Note:No mass. Palpation and Percussion of the abdomen reveal- Non Tender, Non Distended + BS, no rebound or guarding.    Neurologic Cranial Nerve exam:- CN III-XII intact(No nystagmus), symmetric smile. Romberg Exam:- Negative.  Heal to Toe Gait exam:-Normal. Finger to Nose:- Normal/Intact Strength:- 5/5 equal and symmetric strength both upper and lower extremities.        Assessment & Plan:

## 2013-12-16 NOTE — Assessment & Plan Note (Signed)
Intermittent and controlled with benzo. Did refill his klonopin. Discussion and encouraged on limited use. Pt agrees that is his goal. Hopefully sertraline moderate dose would decrease reliance on benzo.

## 2013-12-16 NOTE — Progress Notes (Signed)
Pre visit review using our clinic review tool, if applicable. No additional management support is needed unless otherwise documented below in the visit note. 

## 2014-01-14 ENCOUNTER — Telehealth: Payer: Self-pay | Admitting: *Deleted

## 2014-01-14 ENCOUNTER — Ambulatory Visit: Payer: 59 | Admitting: Medical

## 2014-01-14 DIAGNOSIS — Z0289 Encounter for other administrative examinations: Secondary | ICD-10-CM

## 2014-01-14 NOTE — Telephone Encounter (Signed)
Pt did not show for appointment 01/14/2014 at 11:15am for 1 month follow up

## 2014-01-18 ENCOUNTER — Encounter: Payer: Self-pay | Admitting: Medical

## 2014-01-18 ENCOUNTER — Ambulatory Visit (INDEPENDENT_AMBULATORY_CARE_PROVIDER_SITE_OTHER): Payer: 59 | Admitting: Medical

## 2014-01-18 VITALS — BP 126/89 | HR 68 | Temp 97.9°F | Ht 68.75 in | Wt 197.2 lb

## 2014-01-18 DIAGNOSIS — F3289 Other specified depressive episodes: Secondary | ICD-10-CM

## 2014-01-18 DIAGNOSIS — I10 Essential (primary) hypertension: Secondary | ICD-10-CM

## 2014-01-18 DIAGNOSIS — L989 Disorder of the skin and subcutaneous tissue, unspecified: Secondary | ICD-10-CM

## 2014-01-18 DIAGNOSIS — F32A Depression, unspecified: Secondary | ICD-10-CM

## 2014-01-18 DIAGNOSIS — F329 Major depressive disorder, single episode, unspecified: Secondary | ICD-10-CM

## 2014-01-18 DIAGNOSIS — F411 Generalized anxiety disorder: Secondary | ICD-10-CM

## 2014-01-18 DIAGNOSIS — K219 Gastro-esophageal reflux disease without esophagitis: Secondary | ICD-10-CM

## 2014-01-18 MED ORDER — CLONAZEPAM 0.5 MG PO TABS: 0.5000 mg | ORAL_TABLET | Freq: Two times a day (BID) | ORAL | Status: DC | PRN

## 2014-01-18 MED ORDER — SERTRALINE HCL 100 MG PO TABS: 100.0000 mg | ORAL_TABLET | Freq: Every day | ORAL | Status: DC

## 2014-01-18 MED ORDER — LISINOPRIL-HYDROCHLOROTHIAZIDE 10-12.5 MG PO TABS: 1.0000 | ORAL_TABLET | Freq: Every day | ORAL | Status: DC

## 2014-01-18 NOTE — Assessment & Plan Note (Signed)
Much improved with his anxiety on sertraline 50 mg by mouth daily. He does take clonazepam about one tablet by mouth daily. He wanted to increase sertraline 100 mg and this may decrease frequency of use of the clonazepam. I did refill his clonazepam today and gave him proximally enough for 3 months. I informed him that on the followup I want him to sign a controlled medication contract. I informed him that he was not being individually selected but that we use contracts with all persons who received control medications from our practice on regular basis. Patient expressed understanding.

## 2014-01-18 NOTE — Assessment & Plan Note (Signed)
Patient has a irritated area in the left parietal region of the scalp. This area has been irritated and not healed completely for about one year. Initially he expressed that he did not want to see a specialist anytime soon. However I did counsel him that it is very important that the area needs to be evaluated quickly. I explained to him that nonhealing wounds always needed evaluation. Particularly with his fair skin and his family history. He then agreed that he would go.

## 2014-01-18 NOTE — Assessment & Plan Note (Addendum)
Patient symptoms are still under control he will continue with Prilosec. When patient has his annual complete and is referred to GI for screening colonoscopy, I will inform them about his reflux history.

## 2014-01-18 NOTE — Assessment & Plan Note (Signed)
Much improved with the sertraline 50 mg by mouth daily. Patient is willing to try 100 mg to see if that will normalize his mood completely.

## 2014-01-18 NOTE — Assessment & Plan Note (Signed)
His blood pressure is under good control today. He states I accidentally called in higher dose of his medication on the last visit. He has been on the lisinopril 10/12.5 mg 1 tablet by mouth daily. The pharmacy wrote previous medication he was are on.  I wrote him refills of this medication today and advise him that his potassium was in the lower range region on prior BMPs. I encouraged him to eat a banana every day or every other day to make sure his potassium stays within normal limits.

## 2014-01-18 NOTE — Progress Notes (Signed)
Subjective:    Patient ID: Troy Stuart, male    DOB: 04/26/1965, 48 y.o.   MRN: [id number]  HPI  Pt states that sertraline is working. He thinks he is improved about 70% improvement with both anxiety and depression. He still does not feel like energy level is where it should be. He is walking every other day. Pt sleeping ok although he works 5 pm - 3 am. Pt uses the clonazepam about one time a day. Last time took it was Saturday.  Pt gerd symptoms are under control. He is on prilosec otc. No GI signs or symptoms reported.  Pt bp  Is well controlled today. He tells me he is on the lisinopril 10/12.5 mg q day. His bp checks at home every third day or always very similar to today's readings.   Near the end of the interview he mentioned that he had an area on the left side of his scalp that was present for about 1 year. The area is constantly irritated and has not healed completely. He does note that his father had some skin cancer issues in the past. Also he points out that he had a lesion on his left forearm removed by his prior primary care provider.   Note also regarding his complaint of mild fatigue. On labs done over the summer his CBC was normal, TSH was normal, and his CMP did not show any striking abnormalities.  Past Medical History  Diagnosis Date  . Hypertension   . Acid reflux     History   Social History  . Marital Status: Married    Spouse Name: N/A    Number of Children: N/A  . Years of Education: N/A   Occupational History  . Not on file.   Social History Main Topics  . Smoking status: Never Smoker   . Smokeless tobacco: Never Used  . Alcohol Use: No  . Drug Use: No  . Sexual Activity: Yes   Other Topics Concern  . Not on file   Social History Narrative  . No narrative on file    Past Surgical History  Procedure Laterality Date  . Shoulder arthroscopy    . Hernia repair      2002    Family History  Problem Relation Age of Onset  .  Diabetes Mother   . Mental illness Father   . Depression Brother     Allergies  Allergen Reactions  . Penicillins     unknown    Current Outpatient Prescriptions on File Prior to Visit  Medication Sig Dispense Refill  . ciprofloxacin (CIPRO) 500 MG tablet Take 1 tablet (500 mg total) by mouth 2 (two) times daily.  20 tablet  0  . dextromethorphan (DELSYM) 30 MG/5ML liquid Take 60 mg by mouth as needed. For cough       . lisinopril (PRINIVIL,ZESTRIL) 20 MG tablet Take 1 tablet (20 mg total) by mouth daily.  30 tablet  5  . metroNIDAZOLE (FLAGYL) 500 MG tablet Take 1 tablet (500 mg total) by mouth 3 (three) times daily.  30 tablet  0  . naproxen sodium (ANAPROX) 220 MG tablet Take 220 mg by mouth 2 (two) times daily with a meal.      . omeprazole (PRILOSEC OTC) 20 MG tablet Take 20 mg by mouth daily.        . ondansetron (ZOFRAN ODT) 4 MG disintegrating tablet Take 1 tablet (4 mg total) by mouth every 8 (eight) hours as needed  for nausea or vomiting.  20 tablet  0  . ondansetron (ZOFRAN ODT) 8 MG disintegrating tablet 8mg  ODT q4 hours prn nausea  4 tablet  0  . Ondansetron HCl (ZOFRAN PO) Take 1 tablet by mouth once.        . Pseudoeph-Doxylamine-DM-APAP 30-6.25-15-325 MG CAPS Take 2 capsules by mouth every 6 (six) hours as needed. For congestion       . Vitamin D, Ergocalciferol, (DRISDOL) 50000 UNITS CAPS capsule Take 50,000 Units by mouth every 7 (seven) days.       No current facility-administered medications on file prior to visit.    BP 126/89  Pulse 68  Temp(Src) 97.9 F (36.6 C) (Oral)  Ht 5' 8.75" (1.746 m)  Wt 197 lb 3.2 oz (89.449 kg)  BMI 29.34 kg/m2  SpO2 96%          Review of Systems  Constitutional: Positive for fatigue. Negative for fever and chills.       Energy level is improved but not where he would like to be.  HENT: Negative.   Respiratory: Negative for cough, choking, chest tightness and wheezing.   Cardiovascular: Negative for chest pain and  palpitations.  Gastrointestinal: Negative.        GERD controlled.  Genitourinary: Negative.   Musculoskeletal: Negative.   Skin:       Lesion on his left side of the scalp. Present for one year. Area is irritated with small breakdown in the epidermis.  Neurological: Negative for dizziness, syncope, speech difficulty, weakness, light-headedness, numbness and headaches.  Hematological: Negative for adenopathy.  Psychiatric/Behavioral: Negative for suicidal ideas, behavioral problems, confusion, sleep disturbance, dysphoric mood and decreased concentration. The patient is nervous/anxious.        Still reporting some decreased energy. But overall his mood is much better and his anxiety is much better. He states 70% improvement. His wife can tell a significant difference       Objective:   Physical Exam  Constitutional: He is oriented to person, place, and time. He appears well-developed and well-nourished. No distress.  HENT:  Head: Normocephalic and atraumatic.  Eyes: Conjunctivae and EOM are normal. Pupils are equal, round, and reactive to light.  Neck: Normal range of motion. Neck supple. No JVD present. No tracheal deviation present. No thyromegaly present.  No carotid bruits.  Cardiovascular: Normal rate, regular rhythm and normal heart sounds.  Exam reveals no gallop and no friction rub.   No murmur heard. Pulmonary/Chest: Effort normal and breath sounds normal. No stridor. No respiratory distress. He has no wheezes. He has no rales. He exhibits no tenderness.  Abdominal: Soft. Bowel sounds are normal. He exhibits no distension and no mass. There is no tenderness. There is no rebound and no guarding.  Musculoskeletal: Normal range of motion.  Lymphadenopathy:    He has no cervical adenopathy.  Neurological: He is alert and oriented to person, place, and time. A cranial nerve deficit is present.  Cranial nerves III through XII grossly intact.  Skin: He is not diaphoretic.  On  patient's scalp in the left parietal region he has a 9-10 mm area of red irritated skin with some slight breakdown in the center. No discharge. No dark coloring to the skin.  Overall he has a fair complexion and numerous small moles on his back as well.  Psychiatric: He has a normal mood and affect. His behavior is normal. Judgment and thought content normal.          Assessment &  Plan:

## 2014-01-18 NOTE — Patient Instructions (Signed)
For your blood pressure, I refilled her prescription of lisinopril/HCTZ.  For your GERD, continue with the prilosec.  For your depression and anxiety, I am increasing the sertraline 200 mg by mouth daily. I did refill the clonazepam as well.  For your skin lesion, I have referred you to the dermatologist and explained the importance of attending that appointment.  I do want you to schedule a complete physical exam in approximately 2- 3 months after you complete your 48th birthday. At that point we will repeat screening fasting labs and would refer you to the GI for screening colonoscopy.

## 2014-02-22 ENCOUNTER — Emergency Department (HOSPITAL_BASED_OUTPATIENT_CLINIC_OR_DEPARTMENT_OTHER): Payer: 59

## 2014-02-22 ENCOUNTER — Encounter (HOSPITAL_BASED_OUTPATIENT_CLINIC_OR_DEPARTMENT_OTHER): Payer: Self-pay | Admitting: Emergency Medicine

## 2014-02-22 ENCOUNTER — Telehealth: Payer: Self-pay | Admitting: Medical

## 2014-02-22 ENCOUNTER — Emergency Department (HOSPITAL_BASED_OUTPATIENT_CLINIC_OR_DEPARTMENT_OTHER)
Admission: EM | Admit: 2014-02-22 | Discharge: 2014-02-22 | Disposition: A | Discharge status: Home / Self Care | Payer: 59 | Attending: Emergency Medicine | Admitting: Emergency Medicine

## 2014-02-22 DIAGNOSIS — Z79899 Other long term (current) drug therapy: Secondary | ICD-10-CM | POA: Diagnosis not present

## 2014-02-22 DIAGNOSIS — Z880 Allergy status to penicillin: Secondary | ICD-10-CM | POA: Insufficient documentation

## 2014-02-22 DIAGNOSIS — Z792 Long term (current) use of antibiotics: Secondary | ICD-10-CM | POA: Diagnosis not present

## 2014-02-22 DIAGNOSIS — R0602 Shortness of breath: Secondary | ICD-10-CM | POA: Diagnosis not present

## 2014-02-22 DIAGNOSIS — R079 Chest pain, unspecified: Secondary | ICD-10-CM | POA: Diagnosis present

## 2014-02-22 DIAGNOSIS — I10 Essential (primary) hypertension: Secondary | ICD-10-CM | POA: Diagnosis not present

## 2014-02-22 DIAGNOSIS — K210 Gastro-esophageal reflux disease with esophagitis: Secondary | ICD-10-CM | POA: Insufficient documentation

## 2014-02-22 DIAGNOSIS — R0789 Other chest pain: Secondary | ICD-10-CM | POA: Diagnosis not present

## 2014-02-22 DIAGNOSIS — K209 Esophagitis, unspecified without bleeding: Secondary | ICD-10-CM

## 2014-02-22 DIAGNOSIS — Z791 Long term (current) use of non-steroidal anti-inflammatories (NSAID): Secondary | ICD-10-CM | POA: Diagnosis not present

## 2014-02-22 LAB — COMPREHENSIVE METABOLIC PANEL
ALT: 18 U/L (ref 0–53)
AST: 19 U/L (ref 0–37)
Albumin: 3 g/dL — ABNORMAL LOW (ref 3.5–5.2)
Alkaline Phosphatase: 73 U/L (ref 39–117)
Anion gap: 12 (ref 5–15)
BUN: 15 mg/dL (ref 6–23)
CALCIUM: 8.5 mg/dL (ref 8.4–10.5)
CHLORIDE: 105 meq/L (ref 96–112)
CO2: 25 mEq/L (ref 19–32)
Creatinine, Ser: 0.9 mg/dL (ref 0.50–1.35)
GFR calc Af Amer: >90 mL/min (ref >90)
Glucose, Bld: 144 mg/dL — ABNORMAL HIGH (ref 70–99)
Potassium: 3.6 mEq/L — ABNORMAL LOW (ref 3.7–5.3)
Sodium: 142 mEq/L (ref 137–147)
Total Bilirubin: <0.2 mg/dL — ABNORMAL LOW (ref 0.3–1.2)
Total Protein: 6.6 g/dL (ref 6.0–8.3)

## 2014-02-22 LAB — CBC WITH DIFFERENTIAL/PLATELET
BASOS ABS: 0 10*3/uL (ref 0.0–0.1)
Basophils Relative: 0 % (ref 0–1)
EOS PCT: 2 % (ref 0–5)
Eosinophils Absolute: 0.2 10*3/uL (ref 0.0–0.7)
HCT: 43.2 % (ref 39.0–52.0)
Hemoglobin: 14.5 g/dL (ref 13.0–17.0)
Lymphocytes Relative: 32 % (ref 12–46)
Lymphs Abs: 2.9 10*3/uL (ref 0.7–4.0)
MCH: 29.3 pg (ref 26.0–34.0)
MCHC: 33.6 g/dL (ref 30.0–36.0)
MCV: 87.3 fL (ref 78.0–100.0)
Monocytes Absolute: 1.1 10*3/uL — ABNORMAL HIGH (ref 0.1–1.0)
Monocytes Relative: 12 % (ref 3–12)
NEUTROS ABS: 4.9 10*3/uL (ref 1.7–7.7)
NEUTROS PCT: 54 % (ref 43–77)
Platelets: 218 10*3/uL (ref 150–400)
RBC: 4.95 MIL/uL (ref 4.22–5.81)
RDW: 13.3 % (ref 11.5–15.5)
WBC: 9.1 10*3/uL (ref 4.0–10.5)

## 2014-02-22 LAB — TROPONIN I: Troponin I: <0.3 ng/mL (ref <0.30)

## 2014-02-22 MED ORDER — GI COCKTAIL ~~LOC~~
30.0000 mL | Freq: Once | ORAL | Status: AC
  Administered 2014-02-22: 30 mL via ORAL
  Filled 2014-02-22: qty 30

## 2014-02-22 MED ORDER — SUCRALFATE 1 GM/10ML PO SUSP: 1.0000 g | Freq: Three times a day (TID) | ORAL | Status: DC

## 2014-02-22 MED ORDER — HYDROCODONE-ACETAMINOPHEN 5-325 MG PO TABS: 1.0000 | ORAL_TABLET | Freq: Four times a day (QID) | ORAL | Status: DC | PRN

## 2014-02-22 MED ORDER — HYDROCODONE-ACETAMINOPHEN 5-325 MG PO TABS
1.0000 | ORAL_TABLET | Freq: Once | ORAL | Status: AC
Start: 2014-02-22 — End: 2014-02-22
  Administered 2014-02-22: 1 via ORAL
  Filled 2014-02-22: qty 1

## 2014-02-22 NOTE — ED Notes (Signed)
Pt states that he started having chest pain last night.

## 2014-02-22 NOTE — ED Notes (Signed)
Pt walked to xray

## 2014-02-22 NOTE — ED Notes (Signed)
MD at bedside. 

## 2014-02-22 NOTE — ED Provider Notes (Signed)
CSN: [id number]     Arrival date & time 02/22/14  0024 History   First MD Initiated Contact with Patient 02/22/14 0026     Chief Complaint  Patient presents with  . Chest Pain     (Consider location/radiation/quality/duration/timing/severity/associated sxs/prior Treatment) Patient is a 48 y.o. male presenting with chest pain. The history is provided by the patient.  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 Associated symptoms: shortness of breath   Associated symptoms: no abdominal pain, no anorexia, no back pain, no cough, no diaphoresis, no fever, no heartburn, no lower extremity edema, no nausea, no palpitations, not vomiting and no weakness   Risk factors: hypertension and male sex   Risk factors: no coronary artery disease, no diabetes mellitus, no high cholesterol, no immobilization, no prior DVT/PE, no smoking and no surgery     Past Medical History  Diagnosis Date  . Hypertension   . Acid reflux    Past Surgical History  Procedure Laterality Date  . Shoulder arthroscopy    . Hernia repair      2002   Family History  Problem Relation Age of Onset  . Diabetes Mother   . Mental illness Father   . Depression Brother    History  Substance Use Topics  . Smoking status: Never Smoker   . Smokeless tobacco: Never Used  . Alcohol Use: No    Review of Systems  Constitutional: Negative for fever and diaphoresis.  Respiratory: Positive for shortness of breath. Negative for cough.     Cardiovascular: Positive for chest pain. Negative for palpitations.  Gastrointestinal: Negative for heartburn, nausea, vomiting, abdominal pain and anorexia.  Musculoskeletal: Negative for back pain.  Neurological: Negative for weakness.  All other systems reviewed and are negative.     Allergies  Penicillins  Home Medications   Prior to Admission medications   Medication Sig Start Date End Date Taking? Authorizing Provider  ciprofloxacin (CIPRO) 500 MG tablet Take 1 tablet (500 mg total) by mouth 2 (two) times daily. 10/26/13   Merryl Hacker, MD  clonazePAM (KLONOPIN) 0.5 MG tablet Take 1 tablet (0.5 mg total) by mouth 2 (two) times daily as needed for anxiety. 01/18/14   Meriam Sprague Saguier, PA-C  dextromethorphan (DELSYM) 30 MG/5ML liquid Take 60 mg by mouth as needed. For cough     Historical Provider, MD  lisinopril (PRINIVIL,ZESTRIL) 20 MG tablet Take 1 tablet (20 mg total) by mouth daily. 12/16/13   Welby, PA-C  lisinopril-hydrochlorothiazide (PRINZIDE,ZESTORETIC) 10-12.5 MG per tablet Take 1 tablet by mouth daily. 01/18/14   Meriam Sprague Saguier, PA-C  metroNIDAZOLE (FLAGYL) 500 MG tablet Take 1 tablet (500 mg total) by mouth 3 (three) times daily. 10/26/13   Merryl Hacker, MD  naproxen sodium (ANAPROX) 220 MG tablet Take 220 mg by mouth 2 (two) times daily with a meal.    Historical Provider, MD  omeprazole (PRILOSEC OTC) 20 MG tablet Take 20 mg by mouth daily.      Historical Provider, MD  ondansetron (  ZOFRAN ODT) 4 MG disintegrating tablet Take 1 tablet (4 mg total) by mouth every 8 (eight) hours as needed for nausea or vomiting. 10/26/13   Merryl Hacker, MD  ondansetron (ZOFRAN ODT) 8 MG disintegrating tablet 8mg  ODT q4 hours prn nausea 10/10/13   Veryl Speak, MD  Ondansetron HCl (ZOFRAN PO) Take 1 tablet by mouth once.      Historical Provider, MD  Pseudoeph-Doxylamine-DM-APAP 30-6.25-15-325 MG CAPS Take 2 capsules by mouth every 6 (six) hours as needed. For  congestion     Historical Provider, MD  sertraline (ZOLOFT) 100 MG tablet Take 1 tablet (100 mg total) by mouth daily. 01/18/14   Meriam Sprague Saguier, PA-C  Vitamin D, Ergocalciferol, (DRISDOL) 50000 UNITS CAPS capsule Take 50,000 Units by mouth every 7 (seven) days.    Historical Provider, MD   BP 148/88 mmHg  Pulse 69  Temp(Src) 98.7 F (37.1 C) (Oral)  Resp 18  Ht 5\' 7"  (1.702 m)  Wt 200 lb (90.719 kg)  BMI 31.32 kg/m2  SpO2 97% Physical Exam  Constitutional: He is oriented to person, place, and time. He appears well-developed and well-nourished. No distress.  HENT:  Head: Normocephalic and atraumatic.  Mouth/Throat: Oropharynx is clear and moist.  Eyes: Conjunctivae and EOM are normal. Pupils are equal, round, and reactive to light.  Neck: Normal range of motion. Neck supple.  Cardiovascular: Normal rate, regular rhythm and intact distal pulses.   No murmur heard. Pulmonary/Chest: Effort normal and breath sounds normal. No respiratory distress. He has no wheezes. He has no rales. He exhibits tenderness. He exhibits no crepitus and no deformity.    Abdominal: Soft. He exhibits no distension. There is no tenderness. There is no rebound and no guarding.  Musculoskeletal: Normal range of motion. He exhibits no edema or tenderness.  No calf tenderness or swelling present  Neurological: He is alert and oriented to person, place, and time.  Skin: Skin is warm and dry. No rash noted. No erythema.  Psychiatric: He has a normal mood and affect. His behavior is normal.  Nursing note and vitals reviewed.   ED Course  Procedures (including critical care time) Labs Review Labs Reviewed  CBC WITH DIFFERENTIAL - Abnormal; Notable for the following:    Monocytes Absolute 1.1 (*)    All other components within normal limits  COMPREHENSIVE METABOLIC PANEL - Abnormal; Notable for the following:    Potassium 3.6 (*)    Glucose, Bld 144 (*)    Albumin 3.0 (*)    Total Bilirubin <0.2 (*)     All other components within normal limits  TROPONIN I    Imaging Review Dg Chest 2 View  02/22/2014   CLINICAL DATA:  Mid chest pain for 2 days.  EXAM: CHEST  2 VIEW  COMPARISON:  03/27/2011  FINDINGS: Shallow inspiration. The heart size and mediastinal contours are within normal limits. Both lungs are clear. The visualized skeletal structures are unremarkable.  IMPRESSION: No active cardiopulmonary disease.   Electronically Signed   By: Lucienne Capers M.D.   On: 02/22/2014 01:13     EKG Interpretation   Date/Time:  Tuesday February 22 2014 00:31:56 EST Ventricular Rate:  69 PR Interval:  148 QRS Duration: 92 QT Interval:  398 QTC Calculation: 426 R Axis:   70 Text Interpretation:  Normal sinus rhythm Cannot rule out Anterior infarct  , age undetermined Incomplete right bundle branch block Diffuse ST  depression in No previous tracing Confirmed by Maryan Rued  MD, Mikahla Wisor  (  [id number]) on 02/22/2014 12:56:03 AM      MDM   Final diagnoses:  Chest pain  Esophagitis  Chest wall pain    Patient here with an atypical chest pain story. Pain started approximately 36 hours ago and is been constant waxing and waning in severity. It is worse when he takes a deep breath, swallows and moves his trunk. He denies any nausea, vomiting, diaphoresis but has had shortness of breath. No abdominal pain or symptoms concerning for dissection. Only cardiac risk factor is hypertension. He has heart score of 1. EKG shows an incomplete right bundle branch block without other concerning features. Patient is perc negative.  He does have a history of GERD and is not currently taking omeprazole. He also takes ibuprofen daily.  Lower suspicion for cardiac chest pain also because symptoms and been going on for 36 hours 1 troponin will be enough to rule out cardiac cause. Breast sounds are equal bilaterally with lower suspicion for pneumothorax but the possibility of pleurisy or musculoskeletal chest pain. Also  possibility of GERD/esophagitis as the cause of his pain. There are no evidence of infectious symptoms. Vital signs are normal.  CBC, CMP, troponin pending. Patient given GI cocktail.  3:28 AM Labs and CXR are all wnl.  Pt feels significantly better after GI cocktail but also states some of the pain feels like it is in his muscles.  Will treat for esophagitis and chest wall pain.  Pt will f/u with PCP if sx not improving.   Blanchie Dessert, MD 02/22/14 [phone]

## 2014-02-22 NOTE — Telephone Encounter (Signed)
Pt was seen in ED. How is he.Please offer follow up this week.

## 2014-02-23 NOTE — Telephone Encounter (Signed)
Called patient, left message for patient to return call.

## 2014-02-24 ENCOUNTER — Other Ambulatory Visit: Payer: Self-pay

## 2014-08-08 ENCOUNTER — Other Ambulatory Visit: Payer: Self-pay | Admitting: Medical

## 2014-09-05 ENCOUNTER — Other Ambulatory Visit: Payer: Self-pay | Admitting: Medical

## 2014-09-16 ENCOUNTER — Other Ambulatory Visit: Payer: Self-pay | Admitting: Medical

## 2014-09-16 MED ORDER — LISINOPRIL 10 MG PO TABS: 10.0000 mg | ORAL_TABLET | Freq: Every day | ORAL | Status: DC

## 2014-09-16 NOTE — Telephone Encounter (Signed)
I only gave pt lisinopril since not seen since sept. November cmp showed low k. I got this request late Friday at 5 pm. Will try to call his pharmacy and explain he needs office visit next week to check  bp check and lab to check k level. I did call his pharmacy and explained reason why only wrote him the lisinopril. Pharmacist stated they would pass that message to him.

## 2015-01-16 ENCOUNTER — Emergency Department (HOSPITAL_BASED_OUTPATIENT_CLINIC_OR_DEPARTMENT_OTHER)
Admission: EM | Admit: 2015-01-16 | Discharge: 2015-01-17 | Disposition: A | Discharge status: Other Acute Inpatient Hospital | Payer: 59 | Attending: Physician Assistant | Admitting: Physician Assistant

## 2015-01-16 ENCOUNTER — Other Ambulatory Visit: Payer: Self-pay

## 2015-01-16 ENCOUNTER — Encounter (HOSPITAL_BASED_OUTPATIENT_CLINIC_OR_DEPARTMENT_OTHER): Payer: Self-pay | Admitting: *Deleted

## 2015-01-16 DIAGNOSIS — Z79899 Other long term (current) drug therapy: Secondary | ICD-10-CM | POA: Insufficient documentation

## 2015-01-16 DIAGNOSIS — Y288XXA Contact with other sharp object, undetermined intent, initial encounter: Secondary | ICD-10-CM | POA: Diagnosis not present

## 2015-01-16 DIAGNOSIS — K219 Gastro-esophageal reflux disease without esophagitis: Secondary | ICD-10-CM | POA: Diagnosis not present

## 2015-01-16 DIAGNOSIS — F1310 Sedative, hypnotic or anxiolytic abuse, uncomplicated: Secondary | ICD-10-CM | POA: Insufficient documentation

## 2015-01-16 DIAGNOSIS — Y998 Other external cause status: Secondary | ICD-10-CM | POA: Insufficient documentation

## 2015-01-16 DIAGNOSIS — F329 Major depressive disorder, single episode, unspecified: Secondary | ICD-10-CM | POA: Diagnosis not present

## 2015-01-16 DIAGNOSIS — R4589 Other symptoms and signs involving emotional state: Secondary | ICD-10-CM

## 2015-01-16 DIAGNOSIS — Y9289 Other specified places as the place of occurrence of the external cause: Secondary | ICD-10-CM | POA: Insufficient documentation

## 2015-01-16 DIAGNOSIS — S40022A Contusion of left upper arm, initial encounter: Secondary | ICD-10-CM | POA: Insufficient documentation

## 2015-01-16 DIAGNOSIS — I10 Essential (primary) hypertension: Secondary | ICD-10-CM | POA: Diagnosis not present

## 2015-01-16 DIAGNOSIS — Z792 Long term (current) use of antibiotics: Secondary | ICD-10-CM | POA: Insufficient documentation

## 2015-01-16 DIAGNOSIS — Z008 Encounter for other general examination: Secondary | ICD-10-CM | POA: Diagnosis present

## 2015-01-16 DIAGNOSIS — R4689 Other symptoms and signs involving appearance and behavior: Secondary | ICD-10-CM

## 2015-01-16 DIAGNOSIS — Y9389 Activity, other specified: Secondary | ICD-10-CM | POA: Insufficient documentation

## 2015-01-16 DIAGNOSIS — Z880 Allergy status to penicillin: Secondary | ICD-10-CM | POA: Insufficient documentation

## 2015-01-16 HISTORY — DX: Major depressive disorder, single episode, unspecified: F32.9

## 2015-01-16 HISTORY — DX: Depression, unspecified: F32.A

## 2015-01-16 HISTORY — DX: Suicidal ideations: R45.851

## 2015-01-16 LAB — CBC WITH DIFFERENTIAL/PLATELET
Basophils Absolute: 0 10*3/uL (ref 0.0–0.1)
Basophils Relative: 0 %
EOS PCT: 1 %
Eosinophils Absolute: 0.1 10*3/uL (ref 0.0–0.7)
HCT: 46.3 % (ref 39.0–52.0)
Hemoglobin: 15.6 g/dL (ref 13.0–17.0)
LYMPHS PCT: 35 %
Lymphs Abs: 2.8 10*3/uL (ref 0.7–4.0)
MCH: 28.8 pg (ref 26.0–34.0)
MCHC: 33.7 g/dL (ref 30.0–36.0)
MCV: 85.6 fL (ref 78.0–100.0)
MONO ABS: 0.7 10*3/uL (ref 0.1–1.0)
MONOS PCT: 8 %
Neutro Abs: 4.5 10*3/uL (ref 1.7–7.7)
Neutrophils Relative %: 56 %
Platelets: 231 10*3/uL (ref 150–400)
RBC: 5.41 MIL/uL (ref 4.22–5.81)
RDW: 12.9 % (ref 11.5–15.5)
WBC: 8 10*3/uL (ref 4.0–10.5)

## 2015-01-16 LAB — COMPREHENSIVE METABOLIC PANEL
ALK PHOS: 60 U/L (ref 38–126)
ALT: 28 U/L (ref 17–63)
AST: 24 U/L (ref 15–41)
Albumin: 3.9 g/dL (ref 3.5–5.0)
Anion gap: 6 (ref 5–15)
BUN: 22 mg/dL — AB (ref 6–20)
CALCIUM: 8.8 mg/dL — AB (ref 8.9–10.3)
CO2: 31 mmol/L (ref 22–32)
CREATININE: 1.02 mg/dL (ref 0.61–1.24)
Chloride: 103 mmol/L (ref 101–111)
GFR calc non Af Amer: >60 mL/min (ref >60)
Glucose, Bld: 102 mg/dL — ABNORMAL HIGH (ref 65–99)
Potassium: 3.6 mmol/L (ref 3.5–5.1)
Sodium: 140 mmol/L (ref 135–145)
Total Bilirubin: 0.5 mg/dL (ref 0.3–1.2)
Total Protein: 6.9 g/dL (ref 6.5–8.1)

## 2015-01-16 LAB — URINALYSIS, ROUTINE W REFLEX MICROSCOPIC
Bilirubin Urine: NEGATIVE
Glucose, UA: NEGATIVE mg/dL
HGB URINE DIPSTICK: NEGATIVE
Ketones, ur: NEGATIVE mg/dL
Leukocytes, UA: NEGATIVE
NITRITE: NEGATIVE
Protein, ur: 30 mg/dL — AB
SPECIFIC GRAVITY, URINE: 1.028 (ref 1.005–1.030)
Urobilinogen, UA: 0.2 mg/dL (ref 0.0–1.0)
pH: 8.5 — ABNORMAL HIGH (ref 5.0–8.0)

## 2015-01-16 LAB — RAPID URINE DRUG SCREEN, HOSP PERFORMED
Amphetamines: NOT DETECTED
Barbiturates: NOT DETECTED
Benzodiazepines: POSITIVE — AB
Cocaine: NOT DETECTED
OPIATES: NOT DETECTED
Tetrahydrocannabinol: NOT DETECTED

## 2015-01-16 LAB — URINE MICROSCOPIC-ADD ON

## 2015-01-16 LAB — ETHANOL: Alcohol, Ethyl (B): <5 mg/dL (ref <5)

## 2015-01-16 MED ORDER — SERTRALINE HCL 25 MG PO TABS
100.0000 mg | ORAL_TABLET | Freq: Every day | ORAL | Status: DC
  Administered 2015-01-16: 100 mg via ORAL

## 2015-01-16 MED ORDER — LISINOPRIL 10 MG PO TABS
20.0000 mg | ORAL_TABLET | Freq: Every day | ORAL | Status: DC
  Administered 2015-01-16: 20 mg via ORAL

## 2015-01-16 MED ORDER — IBUPROFEN 400 MG PO TABS: 600.0000 mg | ORAL_TABLET | Freq: Three times a day (TID) | ORAL | Status: DC | PRN

## 2015-01-16 MED ORDER — HYDROCHLOROTHIAZIDE 25 MG PO TABS
25.0000 mg | ORAL_TABLET | Freq: Once | ORAL | Status: AC
  Administered 2015-01-16: 25 mg via ORAL

## 2015-01-16 MED ORDER — ACETAMINOPHEN 325 MG PO TABS: 650.0000 mg | ORAL_TABLET | ORAL | Status: DC | PRN

## 2015-01-16 MED ORDER — LORAZEPAM 1 MG PO TABS: 1.0000 mg | ORAL_TABLET | Freq: Three times a day (TID) | ORAL | Status: DC | PRN

## 2015-01-16 NOTE — ED Notes (Signed)
Patients wife states that he now has an appointment tomorrow with a "therapist".

## 2015-01-16 NOTE — ED Notes (Signed)
TTS at bedside. 

## 2015-01-16 NOTE — BH Assessment (Addendum)
Tele Assessment Note   Troy Stuart is an 49 y.o. male that presents to [facility] voluntarily reporting SI and self-harm.  Pt's wife was concerned per pt when he had his "meltdown" this past Friday.  Pt stated he became suicidal then and continues to endorse SI.  Last night he stuck himself in the arm three times with a crochet needle in an attempt to harm himself.  He has a hx of overdose attempt last year and was hospitalized at [facility].  He stated he feels helpless and hopeless.  He has current stressors including financial stress, caring for his wife who had back surgery, and listening to his wife and daughter fight.  Pt reports sadness, crying spells, neurovegetative sx (not bathing, grooming, or caring for self), poor sleep, poor appetite, insomnia and "lack of motivation to do anything."  Pt tearful in assessment.  Pt denies HI.  Pt denies AVH.  No delusions noted.  Pt takes Zoloft for depression from his PCP.  Pt denies SA.  Inpatient psychiatric hospitalization is recommended for the pt at this time due to the nature of his depressive sx and hx of suicide attempt.  Consulted with Dr. Parke Poisson at [facility] who recommends inpatient treatment.  Updated EDP [place] who agreement with pt disposition.  Updated Debarah Crape, AC, TTS, and ED staff.  TTS to seek placement for the pt.  Diagnosis: 296.33 Major Depressive Disorder, Recurrent Episode, Severe  Past Medical History:  Past Medical History  Diagnosis Date  . Hypertension   . Acid reflux   . Depression     Past Surgical History  Procedure Laterality Date  . Shoulder arthroscopy    . Hernia repair      2002    Family History:  Family History  Problem Relation Age of Onset  . Diabetes Mother   . Mental illness Father   . Depression Brother     Social History:  reports that he has never smoked. He has never used smokeless tobacco. He reports that he does not drink alcohol or use illicit drugs.  Additional  Social History:  Alcohol / Drug Use Pain Medications: see med list Prescriptions: see med list Over the Counter: see med list History of alcohol / drug use?: No history of alcohol / drug abuse Longest period of sobriety (when/how long):  (na) Negative Consequences of Use:  (na) Withdrawal Symptoms:  (na)  CIWA: CIWA-Ar BP: 140/91 mmHg Pulse Rate: 63 COWS:    PATIENT STRENGTHS: (choose at least two) Ability for insight Average or above average intelligence Communication skills Financial means General fund of knowledge Motivation for treatment/growth Supportive family/friends Work skills  Allergies:  Allergies  Allergen Reactions  . Penicillins     unknown    Home Medications:  (Not in a hospital admission)  OB/GYN Status:  No LMP for male patient.  General Assessment Data Location of Assessment:  ([facility]) TTS Assessment: In system Is this a Tele or Face-to-Face Assessment?: Tele Assessment Is this an Initial Assessment or a Re-assessment for this encounter?: Initial Assessment Marital status: Married [place] name:  (na) Is patient pregnant?:  (na) Pregnancy Status:  (na) Living Arrangements: Spouse/significant other, Children Can pt return to current living arrangement?: Yes Admission Status: Voluntary Is patient capable of signing voluntary admission?: Yes Referral Source: Self/Family/Friend Insurance type: [place] Screening Exam ([place]) Medical Exam completed:  (na)  Crisis Care Plan Living Arrangements: Spouse/significant other, Children Name of Psychiatrist: none  Name of Therapist: none  Education Status Is patient currently in school?: No Current Grade: na Highest grade of school patient has completed: HS graduate Name of school: na Contact person: na  Risk to self with the past 6 months Suicidal Ideation: Yes-Currently Present Has patient been a risk to self within the past 6 months prior to admission? : Yes Suicidal  Intent: Yes-Currently Present Has patient had any suicidal intent within the past 6 months prior to admission? : Yes Is patient at risk for suicide?: Yes Suicidal Plan?: Yes-Currently Present Has patient had any suicidal plan within the past 6 months prior to admission? : Yes Specify Current Suicidal Plan: stuck pins in arms Access to Means: Yes Specify Access to Suicidal Means: had access to sharps What has been your use of drugs/alcohol within the last 12 months?: na-pt denies Previous Attempts/Gestures: Yes How many times?: 1 (by oversoe) Other Self Harm Risks: na-pt denies Triggers for Past Attempts: Other (Comment) (Depression) Intentional Self Injurious Behavior: None Family Suicide History: No Recent stressful life event(s): Conflict (Comment), Other (Comment) (SI, Depression) Persecutory voices/beliefs?: No Depression: Yes Depression Symptoms: Despondent, Insomnia, Tearfulness, Loss of interest in usual pleasures, Feeling worthless/self pity, Guilt, Fatigue, Isolating Substance abuse history and/or treatment for substance abuse?: No Suicide prevention information given to non-admitted patients: Not applicable  Risk to Others within the past 6 months Homicidal Ideation: No Does patient have any lifetime risk of violence toward others beyond the six months prior to admission? : No Thoughts of Harm to Others: No Current Homicidal Intent: No Current Homicidal Plan: No Access to Homicidal Means: No Identified Victim: na-pt denies History of harm to others?: No Assessment of Violence: None Noted Violent Behavior Description: na-pt cooperative Does patient have access to weapons?: No Criminal Charges Pending?: No Does patient have a court date: Yes Court Date: 02/09/15 (traffic violation) Is patient on probation?: No  Psychosis Hallucinations: None noted Delusions: None noted  Mental Status Report Appearance/Hygiene: Unremarkable, In scrubs Eye Contact: Good Motor  Activity: Freedom of movement, Unremarkable Speech: Logical/coherent Level of Consciousness: Alert Mood: Depressed Affect: Depressed Anxiety Level: Panic Attacks Panic attack frequency: weekly Most recent panic attack: 2 days ago Thought Processes: Coherent, Relevant Judgement: Partial Orientation: Person, Place, Situation Obsessive Compulsive Thoughts/Behaviors: None  Cognitive Functioning Concentration: Decreased Memory: Recent Intact, Remote Intact IQ: Average Insight: Fair Impulse Control: Fair Appetite: Fair Weight Loss: 0 Weight Gain: 0 Sleep: Decreased Total Hours of Sleep:  (varies) Vegetative Symptoms: Staying in bed, Not bathing, Decreased grooming  ADLScreening [facility] Assessment Services) Patient's cognitive ability adequate to safely complete daily activities?: Yes Patient able to express need for assistance with ADLs?: Yes Independently performs ADLs?: Yes (appropriate for developmental age)  Prior Inpatient Therapy Prior Inpatient Therapy: Yes Prior Therapy Dates: 2015 Prior Therapy Facilty/Provider(s): [facility] Reason for Treatment: overdose  Prior Outpatient Therapy Prior Outpatient Therapy: No Prior Therapy Dates: na Prior Therapy Facilty/Provider(s): na Reason for Treatment: na Does patient have an ACCT team?: No Does patient have Intensive In-House Services?  : No Does patient have [facility] services? : No Does patient have P4CC services?: No  ADL Screening (condition at time of admission) Patient's cognitive ability adequate to safely complete daily activities?: Yes Is the patient deaf or have difficulty hearing?: No Does the patient have difficulty seeing, even when wearing glasses/contacts?: No Does the patient have difficulty concentrating, remembering, or making decisions?: No Patient able to express need for assistance with ADLs?: Yes Does the patient have difficulty dressing  or bathing?: No Independently performs ADLs?: Yes  (appropriate for developmental age) Does the patient have difficulty walking or climbing stairs?: No  Home Assistive Devices/Equipment Home Assistive Devices/Equipment: None    Abuse/Neglect Assessment (Assessment to be complete while patient is alone) Physical Abuse: Denies Verbal Abuse: Denies Sexual Abuse: Denies Exploitation of patient/patient's resources: Denies Self-Neglect: Denies Values / Beliefs Cultural Requests During Hospitalization: None Spiritual Requests During Hospitalization: None Consults Spiritual Care Consult Needed: No Social Work Consult Needed: No Regulatory affairs officer (For Healthcare) Does patient have an advance directive?: No Would patient like information on creating an advanced directive?: No - patient declined information    Additional Information 1:1 In Past 12 Months?: No CIRT Risk: No Elopement Risk: No Does patient have medical clearance?: No     Disposition:  Disposition Initial Assessment Completed for this Encounter: Yes Disposition of Patient: Referred to, Inpatient treatment program Type of inpatient treatment program: Adult  Shaune Pascal, MS, [facility] Therapeutic Triage Specialist [facility]   01/16/2015 5:11 PM

## 2015-01-16 NOTE — ED Notes (Signed)
Suicidal thoughts since yesterday. His wife states she found him sticking straight pins in his upper arm. He had been off his depression medication for 2 months and started back on them a week ago.

## 2015-01-16 NOTE — ED Notes (Signed)
Pt wife brought home meds. Counted and recorded. Pt to take home medication per EDP.

## 2015-01-16 NOTE — Progress Notes (Addendum)
Per [facility], patient has a bed at [facility].  Patient to arrive at 23:00, accepting Kylii Ennis is Dr. Sabra Heck, bed 307-1.  Call report at [phone]. RN aware.  Verlon Setting, [place] Disposition staff 01/16/2015 8:05 PM

## 2015-01-16 NOTE — ED Notes (Signed)
Called staffing for a sitter 

## 2015-01-16 NOTE — BH Assessment (Signed)
[place] Assessment Progress Note    Called and scheduled pt's tele assessment with this clinician.  Called and gathered clinical information from [place].  Shaune Pascal, MS, [facility] Therapeutic Triage Specialist [facility]

## 2015-01-16 NOTE — ED Provider Notes (Signed)
CSN: [id number]   Arrival date & time 01/16/15 1345  History  This chart was scribed for Courteney Lyn Thomasene Lot, MD by Altamease Oiler, ED Scribe. This patient was seen in room MH12/MH12 and the patient's care was started at 3:03 PM.  Chief Complaint  Patient presents with  . Medical Clearance    HPI The history is provided by the patient and the spouse. No language interpreter was used.   Troy Stuart is a 49 y.o. male with history of depression who presents to the Emergency Department complaining of SI with onset 2 days ago. Pt has no plan. He had similar thoughts last November.  He has been taking his depression medications over the last 4 days but prior to that he was out of them for 2 months while he looked for a new PCP. Today he was sticking pins in his left upper arm.  Pt denies auditory or visual hallucinations, nausea, vomiting, diarrhea, and chest pain.   Past Medical History  Diagnosis Date  . Hypertension   . Acid reflux   . Depression     Past Surgical History  Procedure Laterality Date  . Shoulder arthroscopy    . Hernia repair      2002    Family History  Problem Relation Age of Onset  . Diabetes Mother   . Mental illness Father   . Depression Brother     Social History  Substance Use Topics  . Smoking status: Never Smoker   . Smokeless tobacco: Never Used  . Alcohol Use: No     Review of Systems  Cardiovascular: Negative for chest pain.  Gastrointestinal: Negative for nausea, vomiting and diarrhea.  Psychiatric/Behavioral: Positive for suicidal ideas and self-injury.  All other systems reviewed and are negative.  Home Medications   Prior to Admission medications   Medication Sig Start Date End Date Taking? Authorizing Provider  ciprofloxacin (CIPRO) 500 MG tablet Take 1 tablet (500 mg total) by mouth 2 (two) times daily. 10/26/13   Merryl Hacker, MD  clonazePAM (KLONOPIN) 0.5 MG tablet Take 1 tablet (0.5 mg total) by mouth 2 (two) times daily  as needed for anxiety. 01/18/14   Percell Miller Saguier, PA-C  dextromethorphan (DELSYM) 30 MG/5ML liquid Take 60 mg by mouth as needed. For cough     Historical Provider, MD  HYDROcodone-acetaminophen (NORCO/VICODIN) 5-325 MG per tablet Take 1-2 tablets by mouth every 6 (six) hours as needed. 02/22/14   Blanchie Dessert, MD  lisinopril (PRINIVIL,ZESTRIL) 10 MG tablet Take 1 tablet (10 mg total) by mouth daily. 09/16/14   Percell Miller Saguier, PA-C  lisinopril-hydrochlorothiazide (PRINZIDE,ZESTORETIC) 10-12.5 MG per tablet Take 1 tablet by mouth daily. 01/18/14   Percell Miller Saguier, PA-C  metroNIDAZOLE (FLAGYL) 500 MG tablet Take 1 tablet (500 mg total) by mouth 3 (three) times daily. 10/26/13   Merryl Hacker, MD  naproxen sodium (ANAPROX) 220 MG tablet Take 220 mg by mouth 2 (two) times daily with a meal.    Historical Provider, MD  omeprazole (PRILOSEC OTC) 20 MG tablet Take 20 mg by mouth daily.      Historical Provider, MD  ondansetron (ZOFRAN ODT) 4 MG disintegrating tablet Take 1 tablet (4 mg total) by mouth every 8 (eight) hours as needed for nausea or vomiting. 10/26/13   Merryl Hacker, MD  ondansetron (ZOFRAN ODT) 8 MG disintegrating tablet 8mg  ODT q4 hours prn nausea 10/10/13   Veryl Speak, MD  Ondansetron HCl (ZOFRAN PO) Take 1 tablet by mouth once.  Historical Provider, MD  Pseudoeph-Doxylamine-DM-APAP 30-6.25-15-325 MG CAPS Take 2 capsules by mouth every 6 (six) hours as needed. For congestion     Historical Provider, MD  sertraline (ZOLOFT) 100 MG tablet TAKE 1 TABLET BY MOUTH EVERY DAY 08/09/14   Mackie Pai, PA-C  sucralfate (CARAFATE) 1 GM/10ML suspension Take 10 mLs (1 g total) by mouth 4 (four) times daily -  with meals and at bedtime. 02/22/14   Blanchie Dessert, MD  Vitamin D, Ergocalciferol, (DRISDOL) 50000 UNITS CAPS capsule Take 50,000 Units by mouth every 7 (seven) days.    Historical Provider, MD    Allergies  Penicillins  Triage Vitals: BP 140/91 mmHg  Pulse 63  Temp(Src) 98.7 F  (37.1 C) (Oral)  Resp 20  Ht 5\' 7"  (1.702 m)  Wt 200 lb (90.719 kg)  BMI 31.32 kg/m2  SpO2 98%  Physical Exam  Constitutional: He is oriented to person, place, and time. He appears well-developed and well-nourished.  HENT:  Head: Normocephalic and atraumatic.  Eyes: EOM are normal.  Neck: Normal range of motion.  Cardiovascular: Normal rate.   Pulmonary/Chest: Effort normal. No respiratory distress.  Abdominal: Soft. He exhibits no distension. There is no tenderness.  Musculoskeletal: Normal range of motion.  Neurological: He is alert and oriented to person, place, and time.  Skin: Skin is warm and dry.  Less than 1 cm bruise to the left upper arm  Nursing note and vitals reviewed.   ED Course  Procedures   DIAGNOSTIC STUDIES: Oxygen Saturation is 98% on RA, normal by my interpretation.    COORDINATION OF CARE: 3:07 PM Discussed treatment plan which includes lab work and TTS consult with pt at bedside and pt agreed to plan.  4:39 PM D/w Hughes Better from TTS who will arrange tele-assessment.   Labs Reviewed  COMPREHENSIVE METABOLIC PANEL - Abnormal; Notable for the following:    Glucose, Bld 102 (*)    BUN 22 (*)    Calcium 8.8 (*)    All other components within normal limits  URINE RAPID DRUG SCREEN, HOSP PERFORMED - Abnormal; Notable for the following:    Benzodiazepines POSITIVE (*)    All other components within normal limits  URINALYSIS, ROUTINE W REFLEX MICROSCOPIC (NOT AT [facility]) - Abnormal; Notable for the following:    pH 8.5 (*)    Protein, ur 30 (*)    All other components within normal limits  ETHANOL  CBC WITH DIFFERENTIAL/PLATELET  URINE MICROSCOPIC-ADD ON    Imaging Review No results found.  EKG Interpretation   Date/Time:    Ventricular Rate:    PR Interval:    QRS Duration:   QT Interval:    QTC Calculation:   R Axis:     Text Interpretation:      EKG Interpretation  Date/Time:  Monday January 16 2015 14:46:16 EDT Ventricular  Rate:  55 PR Interval:  152 QRS Duration: 96 QT Interval:  462 QTC Calculation: 441 R Axis:   71 Text Interpretation:  Sinus bradycardia Nonspecific ST abnormality Abnormal ECG ED PHYSICIAN INTERPRETATION AVAILABLE IN [facility] Confirmed by TEST, Record ([id number]) on 01/17/2015 7:10:56 AM        MDM   Final diagnoses:  None   Patietn stated he is SI. History of the same with hosptilization. Patient stuck his upper arm with a needle. No sig trauma. Will call TTS.   I personally performed the services described in this documentation, which was scribed in my presence. The recorded information has  been reviewed and is accurate.     Courteney Julio Alm, MD 01/18/15 0010

## 2015-01-17 ENCOUNTER — Encounter (HOSPITAL_COMMUNITY): Payer: Self-pay | Admitting: *Deleted

## 2015-01-17 ENCOUNTER — Inpatient Hospital Stay (HOSPITAL_COMMUNITY)
Admission: AD | Admit: 2015-01-17 | Discharge: 2015-01-18 | DRG: 885 | Disposition: A | Discharge status: Home / Self Care | Payer: 59 | Source: Intra-hospital | Attending: Psychiatry | Admitting: Psychiatry

## 2015-01-17 DIAGNOSIS — G4700 Insomnia, unspecified: Secondary | ICD-10-CM | POA: Diagnosis present

## 2015-01-17 DIAGNOSIS — F17200 Nicotine dependence, unspecified, uncomplicated: Secondary | ICD-10-CM | POA: Diagnosis present

## 2015-01-17 DIAGNOSIS — F339 Major depressive disorder, recurrent, unspecified: Secondary | ICD-10-CM | POA: Diagnosis present

## 2015-01-17 DIAGNOSIS — Z833 Family history of diabetes mellitus: Secondary | ICD-10-CM | POA: Diagnosis not present

## 2015-01-17 DIAGNOSIS — R45851 Suicidal ideations: Secondary | ICD-10-CM | POA: Diagnosis present

## 2015-01-17 DIAGNOSIS — I10 Essential (primary) hypertension: Secondary | ICD-10-CM | POA: Diagnosis present

## 2015-01-17 DIAGNOSIS — F332 Major depressive disorder, recurrent severe without psychotic features: Principal | ICD-10-CM | POA: Insufficient documentation

## 2015-01-17 DIAGNOSIS — F411 Generalized anxiety disorder: Secondary | ICD-10-CM | POA: Diagnosis present

## 2015-01-17 DIAGNOSIS — F410 Panic disorder [episodic paroxysmal anxiety] without agoraphobia: Secondary | ICD-10-CM | POA: Diagnosis present

## 2015-01-17 DIAGNOSIS — K219 Gastro-esophageal reflux disease without esophagitis: Secondary | ICD-10-CM | POA: Diagnosis present

## 2015-01-17 MED ORDER — HYDROCHLOROTHIAZIDE 12.5 MG PO CAPS
12.5000 mg | ORAL_CAPSULE | Freq: Every day | ORAL | Status: DC
  Administered 2015-01-17 – 2015-01-18 (×2): 12.5 mg via ORAL
  Filled 2015-01-17 (×4): qty 1

## 2015-01-17 MED ORDER — TRAZODONE HCL 50 MG PO TABS: 50.0000 mg | ORAL_TABLET | Freq: Every evening | ORAL | Status: DC | PRN

## 2015-01-17 MED ORDER — MAGNESIUM HYDROXIDE 400 MG/5ML PO SUSP
30.0000 mL | Freq: Every day | ORAL | Status: DC | PRN
Start: 2015-01-17 — End: 2015-01-18

## 2015-01-17 MED ORDER — ACETAMINOPHEN 325 MG PO TABS: 650.0000 mg | ORAL_TABLET | Freq: Four times a day (QID) | ORAL | Status: DC | PRN

## 2015-01-17 MED ORDER — NICOTINE 21 MG/24HR TD PT24
21.0000 mg | MEDICATED_PATCH | Freq: Every day | TRANSDERMAL | Status: DC
  Filled 2015-01-17 (×4): qty 1

## 2015-01-17 MED ORDER — CLONAZEPAM 0.5 MG PO TABS
0.5000 mg | ORAL_TABLET | Freq: Two times a day (BID) | ORAL | Status: DC | PRN
  Administered 2015-01-18: 0.5 mg via ORAL
  Filled 2015-01-17: qty 1

## 2015-01-17 MED ORDER — CLONAZEPAM 0.5 MG PO TABS
0.5000 mg | ORAL_TABLET | Freq: Every day | ORAL | Status: DC
  Administered 2015-01-17: 0.5 mg via ORAL
  Filled 2015-01-17: qty 1

## 2015-01-17 MED ORDER — PANTOPRAZOLE SODIUM 40 MG PO TBEC
40.0000 mg | DELAYED_RELEASE_TABLET | Freq: Every day | ORAL | Status: DC
  Administered 2015-01-17: 40 mg via ORAL
  Filled 2015-01-17 (×5): qty 1

## 2015-01-17 MED ORDER — ZOLPIDEM TARTRATE 10 MG PO TABS
10.0000 mg | ORAL_TABLET | Freq: Every evening | ORAL | Status: DC | PRN
  Administered 2015-01-17: 10 mg via ORAL
  Filled 2015-01-17: qty 1

## 2015-01-17 MED ORDER — OMEPRAZOLE MAGNESIUM 20 MG PO TBEC: 20.0000 mg | DELAYED_RELEASE_TABLET | Freq: Every day | ORAL | Status: DC

## 2015-01-17 MED ORDER — SERTRALINE HCL 100 MG PO TABS
100.0000 mg | ORAL_TABLET | Freq: Every day | ORAL | Status: DC
  Administered 2015-01-17 – 2015-01-18 (×2): 100 mg via ORAL
  Filled 2015-01-17 (×4): qty 1

## 2015-01-17 MED ORDER — ALUM & MAG HYDROXIDE-SIMETH 200-200-20 MG/5ML PO SUSP: 30.0000 mL | ORAL | Status: DC | PRN

## 2015-01-17 MED ORDER — LISINOPRIL-HYDROCHLOROTHIAZIDE 10-12.5 MG PO TABS: 1.0000 | ORAL_TABLET | Freq: Every day | ORAL | Status: DC

## 2015-01-17 MED ORDER — SUCRALFATE 1 GM/10ML PO SUSP
1.0000 g | Freq: Three times a day (TID) | ORAL | Status: DC
  Filled 2015-01-17 (×12): qty 10

## 2015-01-17 MED ORDER — LISINOPRIL 10 MG PO TABS
10.0000 mg | ORAL_TABLET | Freq: Every day | ORAL | Status: DC
  Administered 2015-01-17 – 2015-01-18 (×2): 10 mg via ORAL
  Filled 2015-01-17 (×4): qty 1

## 2015-01-17 NOTE — Progress Notes (Signed)
Pt attended the evening AA speaker meeting. 

## 2015-01-17 NOTE — H&P (Signed)
Psychiatric Admission Assessment Adult  Patient Identification: Troy Stuart MRN:  [id number] Date of Evaluation:  01/17/2015 Chief Complaint:  MDD RECURRENT SEVERE Principal Diagnosis: <principal problem not specified> Diagnosis:   Patient Active Problem List   Diagnosis Date Noted  . Skin lesion [L98.9] 01/18/2014  . Depression [F32.9] 12/16/2013  . Anxiety state, unspecified [F41.1] 12/16/2013  . Essential hypertension, benign [I10] 12/16/2013  . GERD (gastroesophageal reflux disease) [K21.9] 12/16/2013   History of Present Illness:: 49 Y/O male who states he was not having good thoughts. Was thinking about killing himself started 3 days ago. Was sticking needles in his arm. Wife and daughter got in a big argument. Wife is recovering from spinal surgery and is limited in what she can do. States he has been dealing with depression for couple of years. In November tried to kill himself. Woke up he was the only one in the house. States it was impulsive took some pills. States he was taken to [facility]. Has been dealing with depression for 10 years, anxiety for the last 2 years, panic attacks one or two a month. Admits that  the job he had before produced a lot of stress. He had to move a lot. States he has gotten to a point he gets easily overwhelmed. States he worries a lot about his wife who had back surgery, his kids if they are going to be safe, etc.    Troy Stuart is an 49 y.o. male that presents to [place] voluntarily reporting SI and self-harm. Pt's wife was concerned per pt when he had his "meltdown" this past Friday. Pt stated he became suicidal then and continues to endorse SI. Last night he stuck himself in the arm three times with a crochet needle in an attempt to harm himself. He has a hx of overdose attempt last year and was hospitalized at [facility]. He stated he feels helpless and hopeless. He has current stressors including  financial stress, caring for his wife who had back surgery, and listening to his wife and daughter fight. Pt reports sadness, crying spells, neurovegetative sx (not bathing, grooming, or caring for self), poor sleep, poor appetite, insomnia and "lack of motivation to do anything." Pt tearful in assessment. Pt denies HI. Pt denies AVH. No delusions noted. Pt takes Zoloft for depression from his PCP.  Associated Signs/Symptoms: Depression Symptoms:  depressed mood, anhedonia, insomnia, fatigue, feelings of worthlessness/guilt, suicidal thoughts without plan, anxiety, panic attacks, loss of energy/fatigue, disturbed sleep, (Hypo) Manic Symptoms:  denies Anxiety Symptoms:  Excessive Worry, Panic Symptoms, Psychotic Symptoms:  denies PTSD Symptoms: Negative Total Time spent with patient: 45 minutes  Past Psychiatric History:   Risk to Self: Is patient at risk for suicide?: Yes Risk to Others:   Prior Inpatient Therapy:  [facility] in November Prior Outpatient Therapy:  will see Dr. Toy Care in a month. His PCP has been prescribing  Alcohol Screening: 1. How often do you have a drink containing alcohol?: Never 9. Have you or someone else been injured as a result of your drinking?: No 10. Has a relative or friend or a doctor or another health worker been concerned about your drinking or suggested you cut down?: No Alcohol Use Disorder Identification Test Final Score (AUDIT): 0 Substance Abuse History in the last 12 months:  No. Consequences of Substance Abuse: Negative Previous Psychotropic Medications: Yes Zoloft Klonopin  Psychological Evaluations: No  Past Medical History:  Past Medical History  Diagnosis Date  .  Hypertension   . Acid reflux   . Depression   . Suicidal ideations     Past Surgical History  Procedure Laterality Date  . Shoulder arthroscopy    . Hernia repair      2002   Family History:  Family History  Problem Relation Age of Onset  .  Diabetes Mother   . Mental illness Father   . Depression Brother    Family Psychiatric  History: brother ( Zoloft)  mother depression anxiety Social History:  History  Alcohol Use No     History  Drug Use No    Social History   Social History  . Marital Status: Married    Spouse Name: N/A  . Number of Children: N/A  . Years of Education: N/A   Social History Main Topics  . Smoking status: Never Smoker   . Smokeless tobacco: Never Used  . Alcohol Use: No  . Drug Use: No  . Sexual Activity: Yes    Birth Control/ Protection: None   Other Topics Concern  . None   Social History Narrative  Lives with wife and son. 30 years together since [employer], has a daughter 17, son 42. 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 From [place] and with the company many moves got burned out. Used to live in [place], moved to this area currently works with [employer]. Likes his job Additional Social History:    Pain Medications: See home med list Prescriptions: See home med list Over the Counter: See home med list History of alcohol / drug use?: No history of alcohol / drug abuse Longest period of sobriety (when/how long): N/A Negative Consequences of Use:  (N/A) Withdrawal Symptoms:  (N/A)                    Allergies:   Allergies  Allergen Reactions  . Penicillins Hives    .Marland KitchenHas patient had a PCN reaction causing immediate rash, facial/tongue/throat swelling, SOB or lightheadedness with hypotension: No Has patient had a PCN reaction causing severe rash involving mucus membranes or skin necrosis: No Has patient had a PCN reaction that required hospitalization No Has patient had a PCN reaction occurring within the last 10 years: No If all of the above answers are "NO", then may proceed with Cephalosporin use.    Lab Results:  Results for orders placed or performed during the hospital encounter of 01/16/15 (from  the past 48 hour(s))  Comprehensive metabolic panel     Status: Abnormal   Collection Time: 01/16/15  3:20 PM  Result Value Ref Range   Sodium 140 135 - 145 mmol/L   Potassium 3.6 3.5 - 5.1 mmol/L   Chloride 103 101 - 111 mmol/L   CO2 31 22 - 32 mmol/L   Glucose, Bld 102 (H) 65 - 99 mg/dL   BUN 22 (H) 6 - 20 mg/dL   Creatinine, Ser 1.02 0.61 - 1.24 mg/dL   Calcium 8.8 (L) 8.9 - 10.3 mg/dL   Total Protein 6.9 6.5 - 8.1 g/dL   Albumin 3.9 3.5 - 5.0 g/dL   AST 24 15 - 41 U/L   ALT 28 17 - 63 U/L   Alkaline Phosphatase 60 38 - 126 U/L   Total Bilirubin 0.5 0.3 - 1.2 mg/dL   GFR calc non Af Amer >60 >60 mL/min   GFR calc Af Amer >60 >60 mL/min    Comment: (NOTE) The  eGFR has been calculated using the CKD EPI equation. This calculation has not been validated in all clinical situations. eGFR's persistently <60 mL/min signify possible Chronic Kidney Disease.    Anion gap 6 5 - 15  Ethanol     Status: None   Collection Time: 01/16/15  3:20 PM  Result Value Ref Range   Alcohol, Ethyl (B) <5 <5 mg/dL    Comment:        LOWEST DETECTABLE LIMIT FOR SERUM ALCOHOL IS 5 mg/dL FOR MEDICAL PURPOSES ONLY   CBC with Diff     Status: None   Collection Time: 01/16/15  3:20 PM  Result Value Ref Range   WBC 8.0 4.0 - 10.5 K/uL   RBC 5.41 4.22 - 5.81 MIL/uL   Hemoglobin 15.6 13.0 - 17.0 g/dL   HCT 46.3 39.0 - 52.0 %   MCV 85.6 78.0 - 100.0 fL   MCH 28.8 26.0 - 34.0 pg   MCHC 33.7 30.0 - 36.0 g/dL   RDW 12.9 11.5 - 15.5 %   Platelets 231 150 - 400 K/uL   Neutrophils Relative % 56 %   Neutro Abs 4.5 1.7 - 7.7 K/uL   Lymphocytes Relative 35 %   Lymphs Abs 2.8 0.7 - 4.0 K/uL   Monocytes Relative 8 %   Monocytes Absolute 0.7 0.1 - 1.0 K/uL   Eosinophils Relative 1 %   Eosinophils Absolute 0.1 0.0 - 0.7 K/uL   Basophils Relative 0 %   Basophils Absolute 0.0 0.0 - 0.1 K/uL  Urine rapid drug screen (hosp performed)not at [facility]     Status: Abnormal   Collection Time: 01/16/15  3:30 PM   Result Value Ref Range   Opiates NONE DETECTED NONE DETECTED   Cocaine NONE DETECTED NONE DETECTED   Benzodiazepines POSITIVE (A) NONE DETECTED   Amphetamines NONE DETECTED NONE DETECTED   Tetrahydrocannabinol NONE DETECTED NONE DETECTED   Barbiturates NONE DETECTED NONE DETECTED    Comment:        DRUG SCREEN FOR MEDICAL PURPOSES ONLY.  IF CONFIRMATION IS NEEDED FOR ANY PURPOSE, NOTIFY LAB WITHIN 5 DAYS.        LOWEST DETECTABLE LIMITS FOR URINE DRUG SCREEN Drug Class       Cutoff (ng/mL) Amphetamine      1000 Barbiturate      200 Benzodiazepine   536 Tricyclics       144 Opiates          300 Cocaine          300 THC              50   Urinalysis, Routine w reflex microscopic (not at [facility])     Status: Abnormal   Collection Time: 01/16/15  3:30 PM  Result Value Ref Range   Color, Urine YELLOW YELLOW   APPearance CLEAR CLEAR   Specific Gravity, Urine 1.028 1.005 - 1.030   pH 8.5 (H) 5.0 - 8.0   Glucose, UA NEGATIVE NEGATIVE mg/dL   Hgb urine dipstick NEGATIVE NEGATIVE   Bilirubin Urine NEGATIVE NEGATIVE   Ketones, ur NEGATIVE NEGATIVE mg/dL   Protein, ur 30 (A) NEGATIVE mg/dL   Urobilinogen, UA 0.2 0.0 - 1.0 mg/dL   Nitrite NEGATIVE NEGATIVE   Leukocytes, UA NEGATIVE NEGATIVE  Urine microscopic-add on     Status: None   Collection Time: 01/16/15  3:30 PM  Result Value Ref Range   Squamous Epithelial / LPF RARE RARE   Bacteria, UA RARE RARE  Urine-Other MUCOUS PRESENT     Metabolic Disorder Labs:  No results found for: HGBA1C, MPG No results found for: PROLACTIN No results found for: CHOL, TRIG, HDL, CHOLHDL, VLDL, LDLCALC  Current Medications: Current Facility-Administered Medications  Medication Dose Route Frequency Provider Last Rate Last Dose  . acetaminophen (TYLENOL) tablet 650 mg  650 mg Oral Q6H PRN Laverle Hobby, PA-C      . alum & mag hydroxide-simeth (MAALOX/MYLANTA) 200-200-20 MG/5ML suspension 30 mL  30 mL Oral Q4H PRN Laverle Hobby, PA-C       . magnesium hydroxide (MILK OF MAGNESIA) suspension 30 mL  30 mL Oral Daily PRN Laverle Hobby, PA-C       PTA Medications: Prescriptions prior to admission  Medication Sig Dispense Refill Last Dose  . clonazePAM (KLONOPIN) 0.5 MG tablet Take 1 tablet (0.5 mg total) by mouth 2 (two) times daily as needed for anxiety. 30 tablet 2 01/16/2015 at Unknown time  . dextromethorphan (DELSYM) 30 MG/5ML liquid Take 60 mg by mouth as needed. For cough    Past Month at Unknown time  . lisinopril (PRINIVIL,ZESTRIL) 10 MG tablet Take 1 tablet (10 mg total) by mouth daily. 7 tablet 0 01/16/2015 at Unknown time  . omeprazole (PRILOSEC OTC) 20 MG tablet Take 20 mg by mouth daily.     Past Week at Unknown time  . sertraline (ZOLOFT) 100 MG tablet TAKE 1 TABLET BY MOUTH EVERY DAY 30 tablet 0 01/16/2015 at Unknown time  . ciprofloxacin (CIPRO) 500 MG tablet Take 1 tablet (500 mg total) by mouth 2 (two) times daily. (Patient not taking: Reported on 01/17/2015) 20 tablet 0 Unknown at Unknown time  . HYDROcodone-acetaminophen (NORCO/VICODIN) 5-325 MG per tablet Take 1-2 tablets by mouth every 6 (six) hours as needed. (Patient not taking: Reported on 01/17/2015) 15 tablet 0 Unknown at Unknown time  . lisinopril-hydrochlorothiazide (PRINZIDE,ZESTORETIC) 10-12.5 MG per tablet Take 1 tablet by mouth daily. (Patient not taking: Reported on 01/17/2015) 30 tablet 3   . metroNIDAZOLE (FLAGYL) 500 MG tablet Take 1 tablet (500 mg total) by mouth 3 (three) times daily. (Patient not taking: Reported on 01/17/2015) 30 tablet 0 Unknown at Unknown time  . ondansetron (ZOFRAN ODT) 4 MG disintegrating tablet Take 1 tablet (4 mg total) by mouth every 8 (eight) hours as needed for nausea or vomiting. (Patient not taking: Reported on 01/17/2015) 20 tablet 0 Unknown at Unknown time  . ondansetron (ZOFRAN ODT) 8 MG disintegrating tablet 61m ODT q4 hours prn nausea (Patient not taking: Reported on 01/17/2015) 4 tablet 0 Unknown at Unknown time  .  sucralfate (CARAFATE) 1 GM/10ML suspension Take 10 mLs (1 g total) by mouth 4 (four) times daily -  with meals and at bedtime. (Patient not taking: Reported on 01/17/2015) 420 mL 0 Unknown at Unknown time    Musculoskeletal: Strength & Muscle Tone: within normal limits Gait & Station: normal Patient leans: normal  Psychiatric Specialty Exam: Physical Exam  Review of Systems  Constitutional: Positive for malaise/fatigue.  HENT: Negative.   Eyes: Positive for blurred vision.  Respiratory: Negative.   Cardiovascular: Negative.   Gastrointestinal: Positive for heartburn.  Genitourinary: Negative.   Musculoskeletal: Positive for joint pain.  Skin: Negative.   Neurological: Negative.   Endo/Heme/Allergies: Negative.   Psychiatric/Behavioral: Positive for depression and suicidal ideas. The patient is nervous/anxious and has insomnia.     Blood pressure 112/76, pulse 73, temperature 97.5 F (36.4 C), temperature source Oral, resp. rate 16, height 5' 6"  (  1.676 m), weight 92.987 kg (205 lb).Body mass index is 33.1 kg/(m^2).  General Appearance: Disheveled  Eye Sport and exercise psychologist::  Fair  Speech:  Clear and Coherent  Volume:  Decreased  Mood:  Anxious and worried  Affect:  anxious worried  Thought Process:  Coherent and Goal Directed  Orientation:  Full (Time, Place, and Person)  Thought Content:  symptoms events worries concerns  Suicidal Thoughts:  No  Homicidal Thoughts:  No  Memory:  Immediate;   Fair Recent;   Fair Remote;   Fair  Judgement:  Fair  Insight:  Present  Psychomotor Activity:  Restlessness  Concentration:  Fair  Recall:  [employer] of Knowledge:Fair  Language: Fair  Akathisia:  No  Handed:  Right  AIMS (if indicated):     Assets:  Desire for Improvement Housing Social Support Vocational/Educational  ADL's:  Intact  Cognition: WNL  Sleep:  Number of Hours: 2.5    Treatment Plan Summary: Daily contact with patient to assess and evaluate symptoms and progress in  treatment and Medication management  Observation Level/Precautions:  15 minute checks  Laboratory:  As per the ED  Psychotherapy:  Individual/group  Medications:  Will resume the Zoloft at 100 mg and the Klonopin 0.5 mg BID and reassess   Consultations:    Discharge Concerns:    Estimated LOS: 3-5 days  Other:    Supportive approach/coping skills Depression; continue the Zoloft consider increasing the dose and or augmenting Anxiety/panic; continue the Zoloft and the Klonopin Insomnia; use Ambien 10 mg HS PRN sleep Work with CBT/mindfulness/stress management I certify that inpatient services furnished can reasonably be expected to improve the patient's condition.   [place] A 9/27/20169:11 AM

## 2015-01-17 NOTE — Tx Team (Signed)
Initial Interdisciplinary Treatment Plan   PATIENT STRESSORS: Financial difficulties Health problems Legal issue Marital or family conflict   PATIENT STRENGTHS: Average or above average intelligence Capable of independent living General fund of knowledge Supportive family/friends   PROBLEM LIST: Problem List/Patient Goals Date to be addressed Date deferred Reason deferred Estimated date of resolution  Depression      Risk for self harm            "I need help dealing with my depression"      "I think I need my meds adjusted"                               DISCHARGE CRITERIA:  Ability to meet basic life and health needs Improved stabilization in mood, thinking, and/or behavior Motivation to continue treatment in a less acute level of care Need for constant or close observation no longer present Verbal commitment to aftercare and medication compliance  PRELIMINARY DISCHARGE PLAN: Attend aftercare/continuing care group Outpatient therapy Return to previous living arrangement Return to previous work or school arrangements  PATIENT/FAMIILY INVOLVEMENT: This treatment plan has been presented to and reviewed with the patient, Troy Stuart, and/or family member.  The patient and family have been given the opportunity to ask questions and make suggestions.  [name] [facility] 01/17/2015, 3:46 AM

## 2015-01-17 NOTE — Progress Notes (Signed)
DAR Note: Patient is calm with flat affect.  Reports fair night sleep.  Denies pain, auditory and visual hallucinations.  Rates depression at 5/10, hopelessness at 4/10, and anxiety at 6/10.  Patient stayed in his room most of the shift.  Maintained on routine safety checks per protocol.  Support and encouragement offered as needed.  States goal is "to get my head together."

## 2015-01-17 NOTE — ED Notes (Signed)
Patient stable and ambulatory.  Patient is calm and cooperative.  Patient walked calmly to [facility].

## 2015-01-17 NOTE — Tx Team (Signed)
Interdisciplinary Treatment Plan Update (Adult)  Date:  01/17/2015  Time Reviewed:  8:31 AM   Progress in Treatment: Attending groups: Yes. Participating in groups:  Yes. Taking medication as prescribed:  Yes. Tolerating medication:  Yes. Family/Significant othe contact made:  SPE completed with pt's mother.  Patient understands diagnosis:  Yes. and As evidenced by:  seeking treatment for depression, med managment Discussing patient identified problems/goals with staff:  Yes. Medical problems stabilized or resolved:  Yes. Denies suicidal/homicidal ideation: Yes. Issues/concerns per patient self-inventory:  Other:  New problem(s) identified:    Discharge Plan or Barriers: [facility] for med management and [facility] for counseling.   Reason for Continuation of Hospitalization: None   Comments:  Troy Stuart is an 49 y.o. male that presents to [place] voluntarily reporting SI and self-harm. Pt's wife was concerned per pt when he had his "meltdown" this past Friday. Pt stated he became suicidal then and continues to endorse SI. Last night he stuck himself in the arm three times with a crochet needle in an attempt to harm himself. He has a hx of overdose attempt last year and was hospitalized at [facility]. He stated he feels helpless and hopeless. He has current stressors including financial stress, caring for his wife who had back surgery, and listening to his wife and daughter fight. Pt reports sadness, crying spells, neurovegetative sx (not bathing, grooming, or caring for self), poor sleep, poor appetite, insomnia and "lack of motivation to do anything." Pt tearful in assessment. Pt denies HI. Pt denies AVH. No delusions noted. Pt takes Zoloft for depression from his PCP. Pt denies SA.Diagnosis upon admission: Major Depressive Disorder, Recurrent Episode, Severe  Estimated length of stay:  D/c today  Additional Comments:   Patient and CSW reviewed pt's identified goals and treatment plan. Patient verbalized understanding and agreed to treatment plan. CSW reviewed [facility] "Discharge Process and Patient Involvement" Form. Pt verbalized understanding of information provided and signed form.    Review of initial/current patient goals per problem list:  1. Goal(s): Patient will participate in aftercare plan  Met: Yes   Target date: at discharge  As evidenced by: Patient will participate within aftercare plan AEB aftercare provider and housing plan at discharge being identified.  9/27: CSW assessing for appropriate referrals.   9/27: Returning home/[facility] for med management and [facility] for counseling with Troy Stuart.   2. Goal (s): Patient will exhibit decreased depressive symptoms and suicidal ideations.  Met: Yes    Target date: at discharge  As evidenced by: Patient will utilize self rating of depression at 3 or below and demonstrate decreased signs of depression or be deemed stable for discharge by MD.  9/27: Pt denies SI/and rates depression as low. Pleasant during interaction; appears to be presenting at his baseline.      Attendees: Patient:   01/17/2015 8:31 AM   Family:   01/17/2015 8:31 AM   Physician:  Dr. Carlton Adam, MD 01/17/2015 8:31 AM   Nursing:   Sharl Ma RN  01/17/2015 8:31 AM   Clinical Social Worker: Troy Stuart, [place]  01/17/2015 8:31 AM   Clinical Social Worker:  Peri Maris LCSWA 01/17/2015 8:31 AM   Other:  Gerline Legacy Nurse Case Manager 01/17/2015 8:31 AM   Other:  Lucinda Dell; [facility]  01/17/2015 8:31 AM   Other:   01/17/2015 8:31 AM   Other:  01/17/2015 8:31 AM   Other:  01/17/2015 8:31 AM   Other:  01/17/2015 8:31 AM    01/17/2015 8:31 AM    01/17/2015 8:31 AM    01/17/2015 8:31 AM    01/17/2015 8:31 AM    Scribe for Treatment Team:   Troy Stuart, [place]  01/17/2015 8:31 AM

## 2015-01-17 NOTE — ED Notes (Signed)
[facility] notified again for transport. States will send someone out now.

## 2015-01-17 NOTE — Progress Notes (Signed)
Vol admit, 49 yo caucasian male, from [place] for depression with SI.  Pt reports his stressors are financial problems and having to listen to his wife and daughter fight and argue all the time.  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  He was calm during the admission, but says he has had crying spells lately and feeling hopeless.  Pt states he has not been sleeping well.  He reports that he attempted to kill himself last year by taking an overdose.  His wife reported to ED staff that she found him sticking himself in the arm with a knitting needle on Friday.  Pt denies SA and does not drink alcohol.  He reports medical hx of HTN and acid reflux.  Paperwork was signed and the search was completed.  Pt was cooperative with the admission process.  Pt was oriented to unit and room.  Pt declined a meal or snack.  Safety checks q15 minutes initiated.

## 2015-01-17 NOTE — Plan of Care (Signed)
Problem: Diagnosis: Increased Risk For Suicide Attempt Goal: STG-Patient Will Comply With Medication Regime Outcome: Progressing Pt compliant with medication regime     

## 2015-01-17 NOTE — Progress Notes (Signed)
Patient ID: Troy Stuart, male   DOB: 01/10/1966, 49 y.o.   MRN: [id number] D: Patient alert and cooperative. complaints of depression and anxiety noted. Mood/affect appeared depressed and flat. Pt denies SI/HI/AVH. No acute distressed noted. A: Medications administered as prescribed. Emotional support given and will continue to monitor pt's progress for stabilization. R: Patient remains safe and complaint with medications.

## 2015-01-17 NOTE — BHH Counselor (Signed)
Adult Comprehensive Assessment  Patient ID: Troy Stuart, male   DOB: 1965-05-24, 49 y.o.   MRN: [id number]  Information Source: Information source: Patient  Current Stressors:   none identified.   Living/Environment/Situation:  Living Arrangements: Spouse/significant other, Children Living conditions (as described by patient or guardian): lives with wife and 30 year old son How long has patient lived in current situation?: 30 years  What is atmosphere in current home: Comfortable, Quarry manager, Supportive  Family History:  Marital status: Married Number of Years Married: 25 What types of issues is patient dealing with in the relationship?: "the secret is communication." Additional relationship information: supportive wife and son.  Does patient have children?: Yes How many children?: 1 How is patient's relationship with their children?: 28 year old son who lives with patient and his wife. "we are really close."   Childhood History:  By whom was/is the patient raised?: Both parents Additional childhood history information: "I had a great childhood. no complaints." Description of patient's relationship with caregiver when they were a child: close to both parents-no substance abuse problems with parents per pt. mother has diagnosis of depression. does well on zoloft.  Patient's description of current relationship with people who raised him/her: close to both parents Does patient have siblings?: Yes Number of Siblings: 2 Description of patient's current relationship with siblings: older brother and younger sister. close to siblings. brother suffers from depression. does well on zoloft.  Did patient suffer any verbal/emotional/physical/sexual abuse as a child?: No Did patient suffer from severe childhood neglect?: No Has patient ever been sexually abused/assaulted/raped as an adolescent or adult?: No Witnessed domestic violence?: No Has patient been effected by domestic violence as  an adult?: No  Education:  Highest grade of school patient has completed: 12th grade  Currently a student?: No Name of school: n/a  Learning disability?: No  Employment/Work Situation:   Employment situation: Employed Where is patient currently employed?: [employer] "I work as a Software engineer has patient been employed?: 2 years  Patient's job has been impacted by current illness: No What is the longest time patient has a held a job?: 20 years 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  Has patient ever been in the [employer]?: No Has patient ever served in combat?: No  Financial Resources:   Financial resources: Income from employment, Income from spouse, Private insurance Does patient have a representative payee or guardian?: No  Alcohol/Substance Abuse:   What has been your use of drugs/alcohol within the last 12 months?: none-pt denies  If attempted suicide, did drugs/alcohol play a role in this?: No Alcohol/Substance Abuse Treatment Hx: Denies past history If yes, describe treatment: n/a  Has alcohol/substance abuse ever caused legal problems?: No  Social Support System:   Pensions consultant Support System: Good Describe Community Support System: supportive coworkers, family and friends Type of faith/religion: n/a  How does patient's faith help to cope with current illness?: n/a   Leisure/Recreation:   Leisure and Hobbies: spending time with wife and family. outdoor activities  Strengths/Needs:   What things does the patient do well?: hard worker; good father and husband; motivated to get help with depression and SI symtpoms In what areas does patient struggle / problems for patient: coping skills; dealing with emotional stressors; SI  Discharge Plan:   Does patient have access to transportation?: Yes Will patient be returning to same living situation after discharge?: Yes Currently receiving community mental health services: Yes (From Whom) (Pt  has Appt  with Dr. Toy Care for Oct and has therapy appt but does not remember with whom. Wife has this information) If no, would patient like referral for services when discharged?: No ([place]) Does patient have financial barriers related to discharge medications?: No  Summary/Recommendations:    Pt is 49 year old male living in [place], [place] with his wife and 78 year old son. Pt presents to [facility] due to SI, depression because of situational/financial stressors. Pt reports difficulty coping with stress and reports one prior suicide attempt by overdose several years ago. He was hospitalized at [facility] in Nov 2015 for similar issues. Pt denies SI/HI/AVH at this time and denies Substance abuse problems. Pt is employed and able to return home with family at discharge. He plans to see Dr. Toy Care for med management and has therapist-Per pt, his wife will provide this information. Recommendations for pt include: crisis stabilization, therapeutic milieu, encourage group attendance and participation, medication management for mood stabilization, and development of comprehensive mental wellness plan.  Maxie Better [facility] 01/17/2015 4:01 PM

## 2015-01-17 NOTE — BHH Suicide Risk Assessment (Signed)
[facility] Admission Suicide Risk Assessment   Nursing information obtained from:  Patient, Review of record Demographic factors:  Male, Caucasian Current Mental Status:  Suicidal ideation indicated by others, Self-harm thoughts Loss Factors:  Decline in physical health, Legal issues, Financial problems / change in socioeconomic status Historical Factors:  Prior suicide attempts Risk Reduction Factors:  Responsible for children under 8 years of age, Sense of responsibility to family, Living with another person, especially a relative, Positive social support Total Time spent with patient: 45 minutes Principal Problem: Major depressive disorder, recurrent episode Diagnosis:   Patient Active Problem List   Diagnosis Date Noted  . Major depressive disorder, recurrent episode [F33.9] 01/17/2015  . GAD (generalized anxiety disorder) [F41.1] 01/17/2015  . Panic attacks [F41.0] 01/17/2015  . Skin lesion [L98.9] 01/18/2014  . Depression [F32.9] 12/16/2013  . Anxiety state, unspecified [F41.1] 12/16/2013  . Essential hypertension, benign [I10] 12/16/2013  . GERD (gastroesophageal reflux disease) [K21.9] 12/16/2013     Continued Clinical Symptoms:  Alcohol Use Disorder Identification Test Final Score (AUDIT): 0 The "Alcohol Use Disorders Identification Test", Guidelines for Use in Primary Care, Second Edition.  World Pharmacologist [facility]). Score between 0-7:  no or low risk or alcohol related problems. Score between 8-15:  moderate risk of alcohol related problems. Score between 16-19:  high risk of alcohol related problems. Score 20 or above:  warrants further diagnostic evaluation for alcohol dependence and treatment.   CLINICAL FACTORS:   Panic Attacks Depression:   Comorbid alcohol abuse/dependence Impulsivity  Psychiatric Specialty Exam: Physical Exam  ROS  Blood pressure 112/76, pulse 73, temperature 97.5 F (36.4 C), temperature source Oral, resp. rate 16, height 5\' 6"  (1.676 m),  weight 92.987 kg (205 lb).Body mass index is 33.1 kg/(m^2).    COGNITIVE FEATURES THAT CONTRIBUTE TO RISK:  Closed-mindedness, Polarized thinking and Thought constriction (tunnel vision)    SUICIDE RISK:   Moderate:  Frequent suicidal ideation with limited intensity, and duration, some specificity in terms of plans, no associated intent, good self-control, limited dysphoria/symptomatology, some risk factors present, and identifiable protective factors, including available and accessible social support.  PLAN OF CARE: See Admission H and PE  Medical Decision Making:  Review of Psycho-Social Stressors (1), Review or order clinical lab tests (1), Review of Medication Regimen & Side Effects (2) and Review of New Medication or Change in Dosage (2)  I certify that inpatient services furnished can reasonably be expected to improve the patient's condition.   LUGO,IRVING A 01/17/2015, 4:55 PM

## 2015-01-17 NOTE — Progress Notes (Signed)
[facility] Group Notes:  (Nursing/MHT/Case Management/Adjunct)  Date:  01/17/2015  Time:  10:30 AM  Type of Therapy:  Psychoeducational Skills  Participation Level:  Did Not Attend  Participation Quality:  Did not attend  Affect:  Did not attend  Cognitive:  Did not attend  Insight:  None  Engagement in Group:  Did not attend  Modes of Intervention:  Did not attend  Summary of Progress/Problems:Pt did not attend group. Pt was with the Doctor during group time. Troy Stuart 01/17/2015, 10:30 AM

## 2015-01-18 DIAGNOSIS — F332 Major depressive disorder, recurrent severe without psychotic features: Secondary | ICD-10-CM | POA: Insufficient documentation

## 2015-01-18 MED ORDER — ZOLPIDEM TARTRATE 10 MG PO TABS: 10.0000 mg | ORAL_TABLET | Freq: Every evening | ORAL | Status: DC | PRN

## 2015-01-18 MED ORDER — LISINOPRIL-HYDROCHLOROTHIAZIDE 10-12.5 MG PO TABS: 1.0000 | ORAL_TABLET | Freq: Every day | ORAL | Status: DC

## 2015-01-18 MED ORDER — SUCRALFATE 1 GM/10ML PO SUSP: 1.0000 g | Freq: Three times a day (TID) | ORAL | Status: DC

## 2015-01-18 MED ORDER — SERTRALINE HCL 100 MG PO TABS: 100.0000 mg | ORAL_TABLET | Freq: Every day | ORAL | Status: DC

## 2015-01-18 MED ORDER — OMEPRAZOLE MAGNESIUM 20 MG PO TBEC: 20.0000 mg | DELAYED_RELEASE_TABLET | Freq: Every day | ORAL | Status: DC

## 2015-01-18 MED ORDER — CLONAZEPAM 0.5 MG PO TABS: 0.5000 mg | ORAL_TABLET | Freq: Two times a day (BID) | ORAL | Status: DC | PRN

## 2015-01-18 MED ORDER — NICOTINE 21 MG/24HR TD PT24: 21.0000 mg | MEDICATED_PATCH | Freq: Every day | TRANSDERMAL | Status: DC

## 2015-01-18 NOTE — BHH Suicide Risk Assessment (Signed)
[facility] INPATIENT:  Family/Significant Other Suicide Prevention Education  Suicide Prevention Education:  Contact Attempts: Enrrique Mierzwa (pt's wife) [phone] has been identified by the patient as the family member/significant other with whom the patient will be residing, and identified as the person(s) who will aid the patient in the event of a mental health crisis.  With written consent from the patient, two attempts were made to provide suicide prevention education, prior to and/or following the patient's discharge.  We were unsuccessful in providing suicide prevention education.  A suicide education pamphlet was given to the patient to share with family/significant other.  Date and time of first attempt: 01/18/15 at 8:30AM Date and time of second attempt: 01/18/15 at 11:20AM (message left requesting call back at her earliest convenience).   Smart, Heather LCSWA 01/18/2015, 11:24 AM

## 2015-01-18 NOTE — BHH Suicide Risk Assessment (Signed)
[facility] Discharge Suicide Risk Assessment   Demographic Factors:  Male and Caucasian  Total Time spent with patient: 30 minutes  Musculoskeletal: Strength & Muscle Tone: within normal limits Gait & Station: normal Patient leans: normal  Psychiatric Specialty Exam: Physical Exam  Review of Systems  Constitutional: Negative.   HENT: Negative.   Eyes: Negative.   Respiratory: Negative.   Cardiovascular: Negative.   Gastrointestinal: Negative.   Genitourinary: Negative.   Musculoskeletal: Negative.   Skin: Negative.   Neurological: Negative.   Endo/Heme/Allergies: Negative.   Psychiatric/Behavioral: Positive for depression.    Blood pressure 121/80, pulse 79, temperature 98.9 F (37.2 C), temperature source Oral, resp. rate 18, height 5\' 6"  (1.676 m), weight 92.987 kg (205 lb).Body mass index is 33.1 kg/(m^2).  General Appearance: Fairly Groomed  Engineer, water::  Fair  Speech:  Clear and [id number]  Volume:  Normal  Mood:  Euthymic  Affect:  Appropriate  Thought Process:  Coherent and Goal Directed  Orientation:  Full (Time, Place, and Person)  Thought Content:  plans as he moves on  Suicidal Thoughts:  No  Homicidal Thoughts:  No  Memory:  Immediate;   Fair Recent;   Fair Remote;   Fair  Judgement:  Fair  Insight:  Present  Psychomotor Activity:  Normal  Concentration:  Fair  Recall:  [employer] [facility] [place]  Language: Fair  Akathisia:  No  Handed:  Right  AIMS (if indicated):     Assets:  Communication Skills Desire for Improvement Housing Social Support Talents/Skills Vocational/Educational  Sleep:  Number of Hours: 6.75  Cognition: WNL  ADL's:  Intact   Have you used any form of tobacco in the last 30 days? (Cigarettes, Smokeless Tobacco, Cigars, and/or Pipes): No  Has this patient used any form of tobacco in the last 30 days? (Cigarettes, Smokeless Tobacco, Cigars, and/or Pipes) No  Mental Status Per Nursing Assessment::   On Admission:  Suicidal  ideation indicated by others, Self-harm thoughts  Current Mental Status by Physician: In full contact with reality. There are no active SI plans or intent. He is willing and motivated to pursue further outpatient treatment   Loss Factors: NA  Historical Factors: NA  Risk Reduction Factors:   Sense of responsibility to family, Employed, Living with another person, especially a relative and Positive social support  Continued Clinical Symptoms:  Panic Attacks Depression:   Impulsivity  Cognitive Features That Contribute To Risk:  Closed-mindedness, Polarized thinking and Thought constriction (tunnel vision)    Suicide Risk:  Minimal: No identifiable suicidal ideation.  Patients presenting with no risk factors but with morbid ruminations; may be classified as minimal risk based on the severity of the depressive symptoms  Principal Problem: Major depressive disorder, recurrent episode Discharge Diagnoses:  Patient Active Problem List   Diagnosis Date Noted  . Major depressive disorder, recurrent episode [F33.9] 01/17/2015  . GAD (generalized anxiety disorder) [F41.1] 01/17/2015  . Panic attacks [F41.0] 01/17/2015  . Skin lesion [L98.9] 01/18/2014  . Depression [F32.9] 12/16/2013  . Anxiety state, unspecified [F41.1] 12/16/2013  . Essential hypertension, benign [I10] 12/16/2013  . GERD (gastroesophageal reflux disease) [K21.9] 12/16/2013    Follow-up Information    Follow up with [facility].   Why:  Please call office at discharge to pay deposit. Appt will be scheduled at that point. Thank you.   Contact information:   ATTN: Dr. Chucky May [address] [place], [place] [address] Phone: [phone] Fax: [phone]  Follow up with [facility] On 01/19/2015.   Why:  Appt on this date according to patient. Social worker left message for office in attempt to confirm appt time/date. Please call office at discharge to  confirm time tomorrow.      Contact information:   ATTN: [address] [place], [place] [address] Phone: [phone] Fax: [phone]      Plan Of Care/Follow-up recommendations:  Activity:  as tolerated Diet:  regular Follow up as above Is patient on multiple antipsychotic therapies at discharge:  No   Has Patient had three or more failed trials of antipsychotic monotherapy by history:  No  Recommended Plan for Multiple Antipsychotic Therapies: NA    Jeramy Dimmick A 01/18/2015, 1:15 PM

## 2015-01-18 NOTE — Progress Notes (Signed)
  [facility] Adult Case Management Discharge Plan :  Will you be returning to the same living situation after discharge:  Yes,  home with wife and son At discharge, do you have transportation home?: Yes,  wife Do you have the ability to pay for your medications: Yes,  Private insurance/[facility]  Release of information consent forms completed and submitted to medical records by CSW.  Patient to Follow up at: Follow-up Information    Follow up with [facility].   Why:  Please call office at discharge to pay deposit. Appt will be scheduled at that point. Thank you.   Contact information:   ATTN: Dr. Chucky May [address] [place], [place] [address] Phone: [phone] Fax: [phone]      Follow up with [facility] On 01/19/2015.   Why:  Appt on this date according to patient. Social worker left message for office in attempt to confirm appt time/date. Please call office at discharge to confirm time tomorrow.      Contact information:   ATTN: [address] [place],  [address] Phone: [phone] Fax: [phone]      Patient denies SI/HI: Yes,  during group/self report    Safety Planning and Suicide Prevention discussed: Yes,  Contact attempts made with pt's wife. SPE completed with pt. SPI pamphlet provided to pt and he was encouraged to share information with support network, ask questions, and talk about any concerns relating to SPE.  Have you used any form of tobacco in the last 30 days? (Cigarettes, Smokeless Tobacco, Cigars, and/or Pipes): No  Has patient been referred to the Quitline?: N/A patient is not a smoker  Smart, [place]  01/18/2015, 11:42 AM

## 2015-01-18 NOTE — Progress Notes (Signed)
Patient ID: Troy Stuart, male   DOB: [birth year], 49 y.o.   MRN: [id number] Discharge- Patient verbalizes readiness for discharge: Denies SI/HI, is not psychotic or delusional.  Patient will come back to [facility] to pick up a letter for work. A- Discharge instructions read and discussed with patient.  All belongings returned to patient. R- Patient was cooperative with discharge process. He verbalizes understanding of discharge instructions.  Signed for return of belongings. Escorted to the lobby and left with his wife.

## 2015-01-18 NOTE — Discharge Summary (Signed)
Physician Discharge Summary Note  Patient:  Troy Stuart is an 49 y.o., male  MRN:  [id number]  DOB:  [birth year]  Patient phone:  [phone] (home)   Patient address:   [place] [address],   Total Time spent with patient: Greater than 30 minutes  Date of Admission:  01/17/2015  Date of Discharge: 01-18-15  Reason for Admission: worsening symptoms of depression/suicidal ideations.  Principal Problem: Major depressive disorder, recurrent episode Discharge Diagnoses: Patient Active Problem List   Diagnosis Date Noted  . Major depressive disorder, recurrent episode [F33.9] 01/17/2015  . GAD (generalized anxiety disorder) [F41.1] 01/17/2015  . Panic attacks [F41.0] 01/17/2015  . Skin lesion [L98.9] 01/18/2014  . Depression [F32.9] 12/16/2013  . Anxiety state, unspecified [F41.1] 12/16/2013  . Essential hypertension, benign [I10] 12/16/2013  . GERD (gastroesophageal reflux disease) [K21.9] 12/16/2013   Musculoskeletal: Strength & Muscle Tone: within normal limits Gait & Station: normal Patient leans: N/A  Psychiatric Specialty Exam: Physical Exam  Psychiatric: His speech is normal and behavior is normal. Judgment and thought content normal. His mood appears not anxious. His affect is not angry, not blunt, not labile and not inappropriate. Cognition and memory are normal. He does not exhibit a depressed mood.    Review of Systems  Constitutional: Negative.   HENT: Negative.   Eyes: Negative.   Respiratory: Negative.   Cardiovascular: Negative.   Gastrointestinal: Negative.   Genitourinary: Negative.   Musculoskeletal: Negative.   Skin: Negative.   Neurological: Negative.   Endo/Heme/Allergies: Negative.   Psychiatric/Behavioral: Positive for depression (Stable) and substance abuse. Negative for suicidal ideas, hallucinations and memory loss. The patient has insomnia (Stable). The patient is not nervous/anxious.     Blood pressure 121/80,  pulse 79, temperature 98.9 F (37.2 C), temperature source Oral, resp. rate 18, height 5' 6"  (1.676 m), weight 92.987 kg (205 lb).Body mass index is 33.1 kg/(m^2).  See Md's SRA   Have you used any form of tobacco in the last 30 days? (Cigarettes, Smokeless Tobacco, Cigars, and/or Pipes): No  Has this patient used any form of tobacco in the last 30 days? (Cigarettes, Smokeless Tobacco, Cigars, and/or Pipes) Yes, A prescription for an FDA-approved tobacco cessation medication was offered at discharge and the patient refused  Past Medical History:  Past Medical History  Diagnosis Date  . Hypertension   . Acid reflux   . Depression   . Suicidal ideations     Past Surgical History  Procedure Laterality Date  . Shoulder arthroscopy    . Hernia repair      2002   Family History:  Family History  Problem Relation Age of Onset  . Diabetes Mother   . Mental illness Father   . Depression Brother    Social History:  History  Alcohol Use No     History  Drug Use No    Social History   Social History  . Marital Status: Married    Spouse Name: N/A  . Number of Children: N/A  . Years of Education: N/A   Social History Main Topics  . Smoking status: Never Smoker   . Smokeless tobacco: Never Used  . Alcohol Use: No  . Drug Use: No  . Sexual Activity: Yes    Birth Control/ Protection: None   Other Topics Concern  . None   Social History Narrative   Risk to Self: Is patient at risk for suicide?: Yes What has been your use of drugs/alcohol within the last 75  months?: none-pt denies  Risk to Others: No Prior Inpatient Therapy: Yes Prior Outpatient Therapy: Yes  Level of Care:  OP  Hospital Course:  49 Y/O male who states he was not having good thoughts. Was thinking about killing himself started 3 days ago. Was sticking needles in his arm. Wife and daughter got in a big argument. Wife is recovering from spinal surgery and is limited in what she can do. States he has been  dealing with depression for couple of years. In November tried to kill himself. Woke up he was the only one in the house. States it was impulsive took some pills. States he was taken to [facility]. Has been dealing with depression for 10 years, anxiety for the last 2 years, panic attacks one or two a month. Admits that the job he had before produced a lot of stress. He had to move a lot. States he has gotten to a point he gets easily overwhelmed. States he worries a lot about his wife who had back surgery, his kids if they are going to be safe, etc.   Troy Stuart was admitted to the hospital with his UDS test reports showing positive Benzodiazepine. However, he did not receive any detoxification treatments as his reason for admission was worsening symptoms of depression requiring mood stabilization treatments. He was also having suicidal ideations & had tried to kill himself last November.  After evaluation of his symptoms, Troy Stuart was started on medication regimen for his presenting symptoms. His medication regimen included; Clonazepam 0.5 mg for anxiety, Sertraline 100 mg for depression & Ambien 10 mg for insomnia. Troy Stuart was also enrolled & participated in the group counseling sessions being offered and held on this unit, he learned coping skills that should help him cope better and maintain mood stability after discharge. He presented other significant pre-existing health issues that required treatment and or monitoring. He was resumed on all his pertinent home medications for those health issues. Troy Stuart tolerated his treatment regimen without any significant adverse effects and or reactions reported.  During his hospital stay, Troy Stuart's symptoms were evaluated on daily basis by a clinical provider to assure that his symptoms are responding well to his treatment regimen, and they were. This is evidenced by his reports of decreasing symptoms, improved mood, sleep & presentation of  good affect.  He is currently being discharged to continue psychiatric treatment and medication management as noted below. He is provided with all the pertinent information required to make this appointment without problems.   On this day of his hospital discharge, Troy Stuart is in much improved condition than upon admission. He contracted for his safety and felt more in control of his mood. His symptoms were reported as significantly decreased or resolved completely. He denies any SI/HI and voiced no AVH. He is instructed & motivated to continue taking medications with a goal of continued improvement in mental health. He was picked up by his wife. He left [facility] in no apparent distress with all belongings.  Consults:  psychiatry  Significant Diagnostic Studies:  labs: CBC with diff, CMP, UDS, toxicology tests, U/A, results reviewed, stable  Discharge Vitals:   Blood pressure 121/80, pulse 79, temperature 98.9 F (37.2 C), temperature source Oral, resp. rate 18, height 5' 6"  (1.676 m), weight 92.987 kg (205 lb). Body mass index is 33.1 kg/(m^2).  Lab Results:   Results for orders placed or performed during the hospital encounter of 01/16/15 (from the past 72 hour(s))  Comprehensive metabolic panel  Status: Abnormal   Collection Time: 01/16/15  3:20 PM  Result Value Ref Range   Sodium 140 135 - 145 mmol/L   Potassium 3.6 3.5 - 5.1 mmol/L   Chloride 103 101 - 111 mmol/L   CO2 31 22 - 32 mmol/L   Glucose, Bld 102 (H) 65 - 99 mg/dL   BUN 22 (H) 6 - 20 mg/dL   Creatinine, Ser 1.02 0.61 - 1.24 mg/dL   Calcium 8.8 (L) 8.9 - 10.3 mg/dL   Total Protein 6.9 6.5 - 8.1 g/dL   Albumin 3.9 3.5 - 5.0 g/dL   AST 24 15 - 41 U/L   ALT 28 17 - 63 U/L   Alkaline Phosphatase 60 38 - 126 U/L   Total Bilirubin 0.5 0.3 - 1.2 mg/dL   GFR calc non Af Amer >60 >60 mL/min   GFR calc Af Amer >60 >60 mL/min    Comment: (NOTE) The eGFR has been calculated using the CKD EPI equation. This calculation has not  been validated in all clinical situations. eGFR's persistently <60 mL/min signify possible Chronic Kidney Disease.    Anion gap 6 5 - 15  Ethanol     Status: None   Collection Time: 01/16/15  3:20 PM  Result Value Ref Range   Alcohol, Ethyl (B) <5 <5 mg/dL    Comment:        LOWEST DETECTABLE LIMIT FOR SERUM ALCOHOL IS 5 mg/dL FOR MEDICAL PURPOSES ONLY   CBC with Diff     Status: None   Collection Time: 01/16/15  3:20 PM  Result Value Ref Range   WBC 8.0 4.0 - 10.5 K/uL   RBC 5.41 4.22 - 5.81 MIL/uL   Hemoglobin 15.6 13.0 - 17.0 g/dL   HCT 46.3 39.0 - 52.0 %   MCV 85.6 78.0 - 100.0 fL   MCH 28.8 26.0 - 34.0 pg   MCHC 33.7 30.0 - 36.0 g/dL   RDW 12.9 11.5 - 15.5 %   Platelets 231 150 - 400 K/uL   Neutrophils Relative % 56 %   Neutro Abs 4.5 1.7 - 7.7 K/uL   Lymphocytes Relative 35 %   Lymphs Abs 2.8 0.7 - 4.0 K/uL   Monocytes Relative 8 %   Monocytes Absolute 0.7 0.1 - 1.0 K/uL   Eosinophils Relative 1 %   Eosinophils Absolute 0.1 0.0 - 0.7 K/uL   Basophils Relative 0 %   Basophils Absolute 0.0 0.0 - 0.1 K/uL  Urine rapid drug screen (hosp performed)not at [facility]     Status: Abnormal   Collection Time: 01/16/15  3:30 PM  Result Value Ref Range   Opiates NONE DETECTED NONE DETECTED   Cocaine NONE DETECTED NONE DETECTED   Benzodiazepines POSITIVE (A) NONE DETECTED   Amphetamines NONE DETECTED NONE DETECTED   Tetrahydrocannabinol NONE DETECTED NONE DETECTED   Barbiturates NONE DETECTED NONE DETECTED    Comment:        DRUG SCREEN FOR MEDICAL PURPOSES ONLY.  IF CONFIRMATION IS NEEDED FOR ANY PURPOSE, NOTIFY LAB WITHIN 5 DAYS.        LOWEST DETECTABLE LIMITS FOR URINE DRUG SCREEN Drug Class       Cutoff (ng/mL) Amphetamine      1000 Barbiturate      200 Benzodiazepine   518 Tricyclics       984 Opiates          300 Cocaine          300 THC  50   Urinalysis, Routine w reflex microscopic (not at [facility])     Status: Abnormal   Collection Time: 01/16/15   3:30 PM  Result Value Ref Range   Color, Urine YELLOW YELLOW   APPearance CLEAR CLEAR   Specific Gravity, Urine 1.028 1.005 - 1.030   pH 8.5 (H) 5.0 - 8.0   Glucose, UA NEGATIVE NEGATIVE mg/dL   Hgb urine dipstick NEGATIVE NEGATIVE   Bilirubin Urine NEGATIVE NEGATIVE   Ketones, ur NEGATIVE NEGATIVE mg/dL   Protein, ur 30 (A) NEGATIVE mg/dL   Urobilinogen, UA 0.2 0.0 - 1.0 mg/dL   Nitrite NEGATIVE NEGATIVE   Leukocytes, UA NEGATIVE NEGATIVE  Urine microscopic-add on     Status: None   Collection Time: 01/16/15  3:30 PM  Result Value Ref Range   Squamous Epithelial / LPF RARE RARE   Bacteria, UA RARE RARE   Urine-Other MUCOUS PRESENT     Physical Findings: AIMS: Facial and Oral Movements Muscles of Facial Expression: None, normal Lips and Perioral Area: None, normal Jaw: None, normal Tongue: None, normal,Extremity Movements Upper (arms, wrists, hands, fingers): None, normal Lower (legs, knees, ankles, toes): None, normal, Trunk Movements Neck, shoulders, hips: None, normal, Overall Severity Severity of abnormal movements (highest score from questions above): None, normal Incapacitation due to abnormal movements: None, normal Patient's awareness of abnormal movements (rate only patient's report): No Awareness, Dental Status Current problems with teeth and/or dentures?: No Does patient usually wear dentures?: No  CIWA:    COWS:     See Psychiatric Specialty Exam and Suicide Risk Assessment completed by Attending Physician prior to discharge.  Discharge destination:  Home  Is patient on multiple antipsychotic therapies at discharge:  No   Has Patient had three or more failed trials of antipsychotic monotherapy by history:  No  Recommended Plan for Multiple Antipsychotic Therapies: NA    Medication List    STOP taking these medications        dextromethorphan 30 MG/5ML liquid  Commonly known as:  DELSYM     ondansetron 8 MG disintegrating tablet  Commonly known as:   ZOFRAN ODT      TAKE these medications      Indication   clonazePAM 0.5 MG tablet  Commonly known as:  KLONOPIN  Take 1 tablet (0.5 mg total) by mouth 2 (two) times daily as needed for anxiety.   Indication:  Anxiety     lisinopril-hydrochlorothiazide 10-12.5 MG tablet  Commonly known as:  PRINZIDE,ZESTORETIC  Take 1 tablet by mouth daily. For high blood pressure   Indication:  High Blood Pressure     nicotine 21 mg/24hr patch  Commonly known as:  NICODERM CQ - dosed in mg/24 hours  Place 1 patch (21 mg total) onto the skin daily at 6 (six) AM. For smoking cessation   Indication:  Nicotine Addiction     omeprazole 20 MG tablet  Commonly known as:  PRILOSEC OTC  Take 1 tablet (20 mg total) by mouth daily. For acid reflux   Indication:  Heartburn     sertraline 100 MG tablet  Commonly known as:  ZOLOFT  Take 1 tablet (100 mg total) by mouth daily. For depression   Indication:  Major Depressive Disorder     sucralfate 1 GM/10ML suspension  Commonly known as:  CARAFATE  Take 10 mLs (1 g total) by mouth 4 (four) times daily -  with meals and at bedtime.   Indication:  Gastroesophageal Reflux Disease  zolpidem 10 MG tablet  Commonly known as:  AMBIEN  Take 1 tablet (10 mg total) by mouth at bedtime as needed for sleep.   Indication:  Trouble Sleeping       Follow-up recommendations:  Activity:  As tolerated Diet: As recommended by your primary care doctor. Keep all scheduled follow-up appointments as recommended.  Comments: Take all your medications as prescribed by your mental healthcare provider. Report any adverse effects and or reactions from your medicines to your outpatient provider promptly. Patient is instructed and cautioned to not engage in alcohol and or illegal drug use while on prescription medicines. In the event of worsening symptoms, patient is instructed to call the crisis hotline, 911 and or go to the nearest ED for appropriate evaluation and treatment  of symptoms. Follow-up with your primary care provider for your other medical issues, concerns and or health care needs.   Total Discharge Time: Greater than 30 minutes  Signed: Encarnacion Slates, PMHNP, FNP-BC 01/18/2015, 10:35 AM  I personally assessed the patient and formulated the plan Geralyn Flash A. Sabra Heck, M.D.

## 2015-04-16 ENCOUNTER — Emergency Department (HOSPITAL_BASED_OUTPATIENT_CLINIC_OR_DEPARTMENT_OTHER): Payer: 59

## 2015-04-16 ENCOUNTER — Encounter (HOSPITAL_BASED_OUTPATIENT_CLINIC_OR_DEPARTMENT_OTHER): Payer: Self-pay | Admitting: Emergency Medicine

## 2015-04-16 ENCOUNTER — Emergency Department (HOSPITAL_BASED_OUTPATIENT_CLINIC_OR_DEPARTMENT_OTHER)
Admission: EM | Admit: 2015-04-16 | Discharge: 2015-04-16 | Disposition: A | Discharge status: Home / Self Care | Payer: 59 | Attending: Emergency Medicine | Admitting: Emergency Medicine

## 2015-04-16 ENCOUNTER — Other Ambulatory Visit: Payer: Self-pay

## 2015-04-16 DIAGNOSIS — R001 Bradycardia, unspecified: Secondary | ICD-10-CM | POA: Diagnosis not present

## 2015-04-16 DIAGNOSIS — R0789 Other chest pain: Secondary | ICD-10-CM | POA: Insufficient documentation

## 2015-04-16 DIAGNOSIS — F329 Major depressive disorder, single episode, unspecified: Secondary | ICD-10-CM | POA: Diagnosis not present

## 2015-04-16 DIAGNOSIS — I10 Essential (primary) hypertension: Secondary | ICD-10-CM | POA: Diagnosis not present

## 2015-04-16 DIAGNOSIS — K219 Gastro-esophageal reflux disease without esophagitis: Secondary | ICD-10-CM | POA: Insufficient documentation

## 2015-04-16 DIAGNOSIS — Z79899 Other long term (current) drug therapy: Secondary | ICD-10-CM | POA: Diagnosis not present

## 2015-04-16 DIAGNOSIS — R079 Chest pain, unspecified: Secondary | ICD-10-CM | POA: Diagnosis present

## 2015-04-16 DIAGNOSIS — Z880 Allergy status to penicillin: Secondary | ICD-10-CM | POA: Insufficient documentation

## 2015-04-16 LAB — BASIC METABOLIC PANEL
Anion gap: 8 (ref 5–15)
BUN: 22 mg/dL — AB (ref 6–20)
CALCIUM: 8.8 mg/dL — AB (ref 8.9–10.3)
CO2: 27 mmol/L (ref 22–32)
CREATININE: 1.25 mg/dL — AB (ref 0.61–1.24)
Chloride: 110 mmol/L (ref 101–111)
GFR calc Af Amer: >60 mL/min (ref >60)
GLUCOSE: 108 mg/dL — AB (ref 65–99)
Potassium: 3.9 mmol/L (ref 3.5–5.1)
SODIUM: 145 mmol/L (ref 135–145)

## 2015-04-16 LAB — HEPATIC FUNCTION PANEL
ALT: 24 U/L (ref 17–63)
AST: 21 U/L (ref 15–41)
Albumin: 3.9 g/dL (ref 3.5–5.0)
Alkaline Phosphatase: 62 U/L (ref 38–126)
BILIRUBIN DIRECT: 0.1 mg/dL (ref 0.1–0.5)
BILIRUBIN INDIRECT: 0.4 mg/dL (ref 0.3–0.9)
BILIRUBIN TOTAL: 0.5 mg/dL (ref 0.3–1.2)
Total Protein: UNDETERMINED g/dL (ref 6.5–8.1)

## 2015-04-16 LAB — CBC
HCT: 45.7 % (ref 39.0–52.0)
Hemoglobin: 14.9 g/dL (ref 13.0–17.0)
MCH: 28.7 pg (ref 26.0–34.0)
MCHC: 32.6 g/dL (ref 30.0–36.0)
MCV: 87.9 fL (ref 78.0–100.0)
PLATELETS: 235 10*3/uL (ref 150–400)
RBC: 5.2 MIL/uL (ref 4.22–5.81)
RDW: 13.8 % (ref 11.5–15.5)
WBC: 8.5 10*3/uL (ref 4.0–10.5)

## 2015-04-16 LAB — D-DIMER, QUANTITATIVE: D-Dimer, Quant: 0.42 ug/mL-FEU (ref 0.00–0.50)

## 2015-04-16 LAB — LIPASE, BLOOD: Lipase: 33 U/L (ref 11–51)

## 2015-04-16 LAB — TROPONIN I

## 2015-04-16 MED ORDER — KETOROLAC TROMETHAMINE 30 MG/ML IJ SOLN
30.0000 mg | Freq: Once | INTRAMUSCULAR | Status: AC
  Administered 2015-04-16: 30 mg via INTRAMUSCULAR
  Filled 2015-04-16: qty 1

## 2015-04-16 MED ORDER — KETOROLAC TROMETHAMINE 15 MG/ML IJ SOLN: 15.0000 mg | Freq: Once | INTRAMUSCULAR | Status: DC

## 2015-04-16 NOTE — ED Notes (Signed)
Pt c/o RUQ, right side chest pain that radiates to his right shoulder.

## 2015-04-16 NOTE — ED Notes (Signed)
Pt verbalizes understanding of d/c instructions and denies any further needs at this time. 

## 2015-04-16 NOTE — ED Notes (Signed)
Patient states that at about 11 am with pain to his right chest. The patient repots that about 30 minutes ago he started to have pain and dizziness and pain with inspiration.

## 2015-04-16 NOTE — ED Provider Notes (Signed)
CSN: [id number]     Arrival date & time 04/16/15  1851 History   By signing my name below, I, Randa Evens, attest that this documentation has been prepared under the direction and in the presence of Quintella Reichert, MD. Electronically Signed: Randa Evens, ED Scribe. 04/16/2015. 8:43 PM.     Chief Complaint  Patient presents with  . Chest Pain   Patient is a 49 y.o. male presenting with chest pain. The history is provided by the patient. No language interpreter was used.  Chest Pain Associated symptoms: no abdominal pain, no cough and no fever    HPI Comments: Troy Stuart is a 49 y.o. male who presents to the Emergency Department complaining of constant worsening right sided CP onset 11 am today. Pt doesn't report any associated treatments. Pt states that taking a deep breaths makes the pain worse. Pt denies  Fever, cough, abdominal pain, leg swelling or other related symptoms. Pt denies recent hospital admissions. Pt denies any recent travels. Symptoms are moderate, intermittent, improving.  Past Medical History  Diagnosis Date  . Hypertension   . Acid reflux   . Depression   . Suicidal ideations    Past Surgical History  Procedure Laterality Date  . Shoulder arthroscopy    . Hernia repair      2002   Family History  Problem Relation Age of Onset  . Diabetes Mother   . Mental illness Father   . Depression Brother    Social History  Substance Use Topics  . Smoking status: Never Smoker   . Smokeless tobacco: Never Used  . Alcohol Use: No    Review of Systems  Constitutional: Negative for fever.  Respiratory: Negative for cough.   Cardiovascular: Positive for chest pain. Negative for leg swelling.  Gastrointestinal: Negative for abdominal pain.  All other systems reviewed and are negative.     Allergies  Penicillins  Home Medications   Prior to Admission medications   Medication Sig Start Date End Date Taking? Authorizing Provider  traZODone  (DESYREL) 50 MG tablet Take 50 mg by mouth at bedtime.   Yes Historical Provider, MD  clonazePAM (KLONOPIN) 0.5 MG tablet Take 1 tablet (0.5 mg total) by mouth 2 (two) times daily as needed for anxiety. 01/18/15   Encarnacion Slates, NP  lisinopril-hydrochlorothiazide (PRINZIDE,ZESTORETIC) 10-12.5 MG tablet Take 1 tablet by mouth daily. For high blood pressure 01/18/15   Encarnacion Slates, NP  nicotine (NICODERM CQ - DOSED IN MG/24 HOURS) 21 mg/24hr patch Place 1 patch (21 mg total) onto the skin daily at 6 (six) AM. For smoking cessation 01/18/15   Encarnacion Slates, NP  omeprazole (PRILOSEC OTC) 20 MG tablet Take 1 tablet (20 mg total) by mouth daily. For acid reflux 01/18/15   Encarnacion Slates, NP  sertraline (ZOLOFT) 100 MG tablet Take 1 tablet (100 mg total) by mouth daily. For depression 01/18/15   Encarnacion Slates, NP  sucralfate (CARAFATE) 1 GM/10ML suspension Take 10 mLs (1 g total) by mouth 4 (four) times daily -  with meals and at bedtime. 01/18/15   Encarnacion Slates, NP  zolpidem (AMBIEN) 10 MG tablet Take 1 tablet (10 mg total) by mouth at bedtime as needed for sleep. 01/18/15 02/17/15  Encarnacion Slates, NP   BP 136/82 mmHg  Pulse 53  Resp 16  SpO2 97%   Physical Exam  Constitutional: He is oriented to person, place, and time. He appears well-developed and well-nourished.  HENT:  Head:  Normocephalic and atraumatic.  Cardiovascular: Regular rhythm.  Bradycardia present.   No murmur heard. Pulmonary/Chest: Effort normal and breath sounds normal. No respiratory distress. He exhibits no tenderness.  Abdominal: Soft. There is no tenderness. There is no rebound and no guarding.  Musculoskeletal: He exhibits no edema or tenderness.  Neurological: He is alert and oriented to person, place, and time.  Skin: Skin is warm and dry. No rash noted.  Psychiatric: He has a normal mood and affect. His behavior is normal.  Nursing note and vitals reviewed.   ED Course  Procedures (including critical care time) DIAGNOSTIC  STUDIES: Oxygen Saturation is 97% on RA, normal by my interpretation.    COORDINATION OF CARE: 8:44 PM-Discussed treatment plan with pt at bedside and pt agreed to plan.     Labs Review Labs Reviewed  BASIC METABOLIC PANEL - Abnormal; Notable for the following:    Glucose, Bld 108 (*)    BUN 22 (*)    Creatinine, Ser 1.25 (*)    Calcium 8.8 (*)    All other components within normal limits  CBC  TROPONIN I  HEPATIC FUNCTION PANEL  LIPASE, BLOOD    Imaging Review Dg Chest 2 View  04/16/2015  CLINICAL DATA:  Right chest pain EXAM: CHEST  2 VIEW COMPARISON:  07/30/2014 FINDINGS: The heart size and mediastinal contours are within normal limits. Both lungs are clear. The visualized skeletal structures are unremarkable. IMPRESSION: No active cardiopulmonary disease. Electronically Signed   By: Jerilynn Mages.  Shick M.D.   On: 04/16/2015 19:24   I have personally reviewed and evaluated these images and lab results as part of my medical decision-making.   EKG Interpretation   Date/Time:  Sunday April 16 2015 18:56:33 EST Ventricular Rate:  63 PR Interval:  146 QRS Duration: 92 QT Interval:  408 QTC Calculation: 417 R Axis:   71 Text Interpretation:  Normal sinus rhythm Nonspecific ST abnormality  Abnormal ECG Confirmed by Hazle Coca [phone]) on 04/16/2015 7:33:35 PM      MDM   Final diagnoses:  Acute chest wall pain   Patient here for evaluation of right-sided chest pain. Presentation is not consistent with cardiac chest pain, cholecystitis, dissection. Patient is low risk for PE, d-dimer is negative. EKG with nonspecific ST changes that is not changed from priors. Plan to DC home with treatment for her chest wall pain/costochondritis. Discussed home care, outpatient follow-up, return depressions. The BMP is slightly elevated compared to priors, discussed outpatient follow-up.  I personally performed the services described in this documentation, which was scribed in my presence. The  recorded information has been reviewed and is accurate.     Quintella Reichert, MD 04/17/15 [phone]

## 2015-04-16 NOTE — Discharge Instructions (Signed)
You can take tylenol, available over the counter for your chest pain.  Your creatinine (kidney function) was slightly higher than on previous studies, please have this followed up by your family doctor.    Chest Wall Pain Chest wall pain is pain in or around the bones and muscles of your chest. Sometimes, an injury causes this pain. Sometimes, the cause may not be known. This pain may take several weeks or longer to get better. HOME CARE INSTRUCTIONS  Pay attention to any changes in your symptoms. Take these actions to help with your pain:   Rest as told by your health care provider.   Avoid activities that cause pain. These include any activities that use your chest muscles or your abdominal and side muscles to lift heavy items.   If directed, apply ice to the painful area:  Put ice in a plastic bag.  Place a towel between your skin and the bag.  Leave the ice on for 20 minutes, 2-3 times per day.  Take over-the-counter and prescription medicines only as told by your health care provider.  Do not use tobacco products, including cigarettes, chewing tobacco, and e-cigarettes. If you need help quitting, ask your health care provider.  Keep all follow-up visits as told by your health care provider. This is important. SEEK MEDICAL CARE IF:  You have a fever.  Your chest pain becomes worse.  You have new symptoms. SEEK IMMEDIATE MEDICAL CARE IF:  You have nausea or vomiting.  You feel sweaty or light-headed.  You have a cough with phlegm (sputum) or you cough up blood.  You develop shortness of breath.   This information is not intended to replace advice given to you by your health care provider. Make sure you discuss any questions you have with your health care provider.   Document Released: 04/08/2005 Document Revised: 12/28/2014 Document Reviewed: 07/04/2014 Elsevier Interactive Patient Education [employer].

## 2015-04-16 NOTE — ED Notes (Signed)
Pt does not want an IV at this time.

## 2015-08-03 ENCOUNTER — Ambulatory Visit (INDEPENDENT_AMBULATORY_CARE_PROVIDER_SITE_OTHER): Payer: 59 | Admitting: Licensed Clinical Social Worker

## 2015-08-03 DIAGNOSIS — F331 Major depressive disorder, recurrent, moderate: Secondary | ICD-10-CM

## 2015-08-17 ENCOUNTER — Ambulatory Visit (INDEPENDENT_AMBULATORY_CARE_PROVIDER_SITE_OTHER): Payer: 59 | Admitting: Licensed Clinical Social Worker

## 2015-08-17 DIAGNOSIS — F331 Major depressive disorder, recurrent, moderate: Secondary | ICD-10-CM | POA: Diagnosis not present

## 2015-09-14 ENCOUNTER — Ambulatory Visit: Payer: 59 | Admitting: Licensed Clinical Social Worker

## 2015-10-09 ENCOUNTER — Ambulatory Visit: Payer: 59 | Admitting: Psychology

## 2015-10-27 ENCOUNTER — Ambulatory Visit: Payer: 59 | Admitting: Psychology

## 2017-09-18 IMAGING — CR DG CHEST 2V
2 series · 2 of 2 positions shown · non-contrast
Comparison: 07/30/2014

CLINICAL DATA: Right chest pain

EXAM:
CHEST  2 VIEW

[w chest pa]
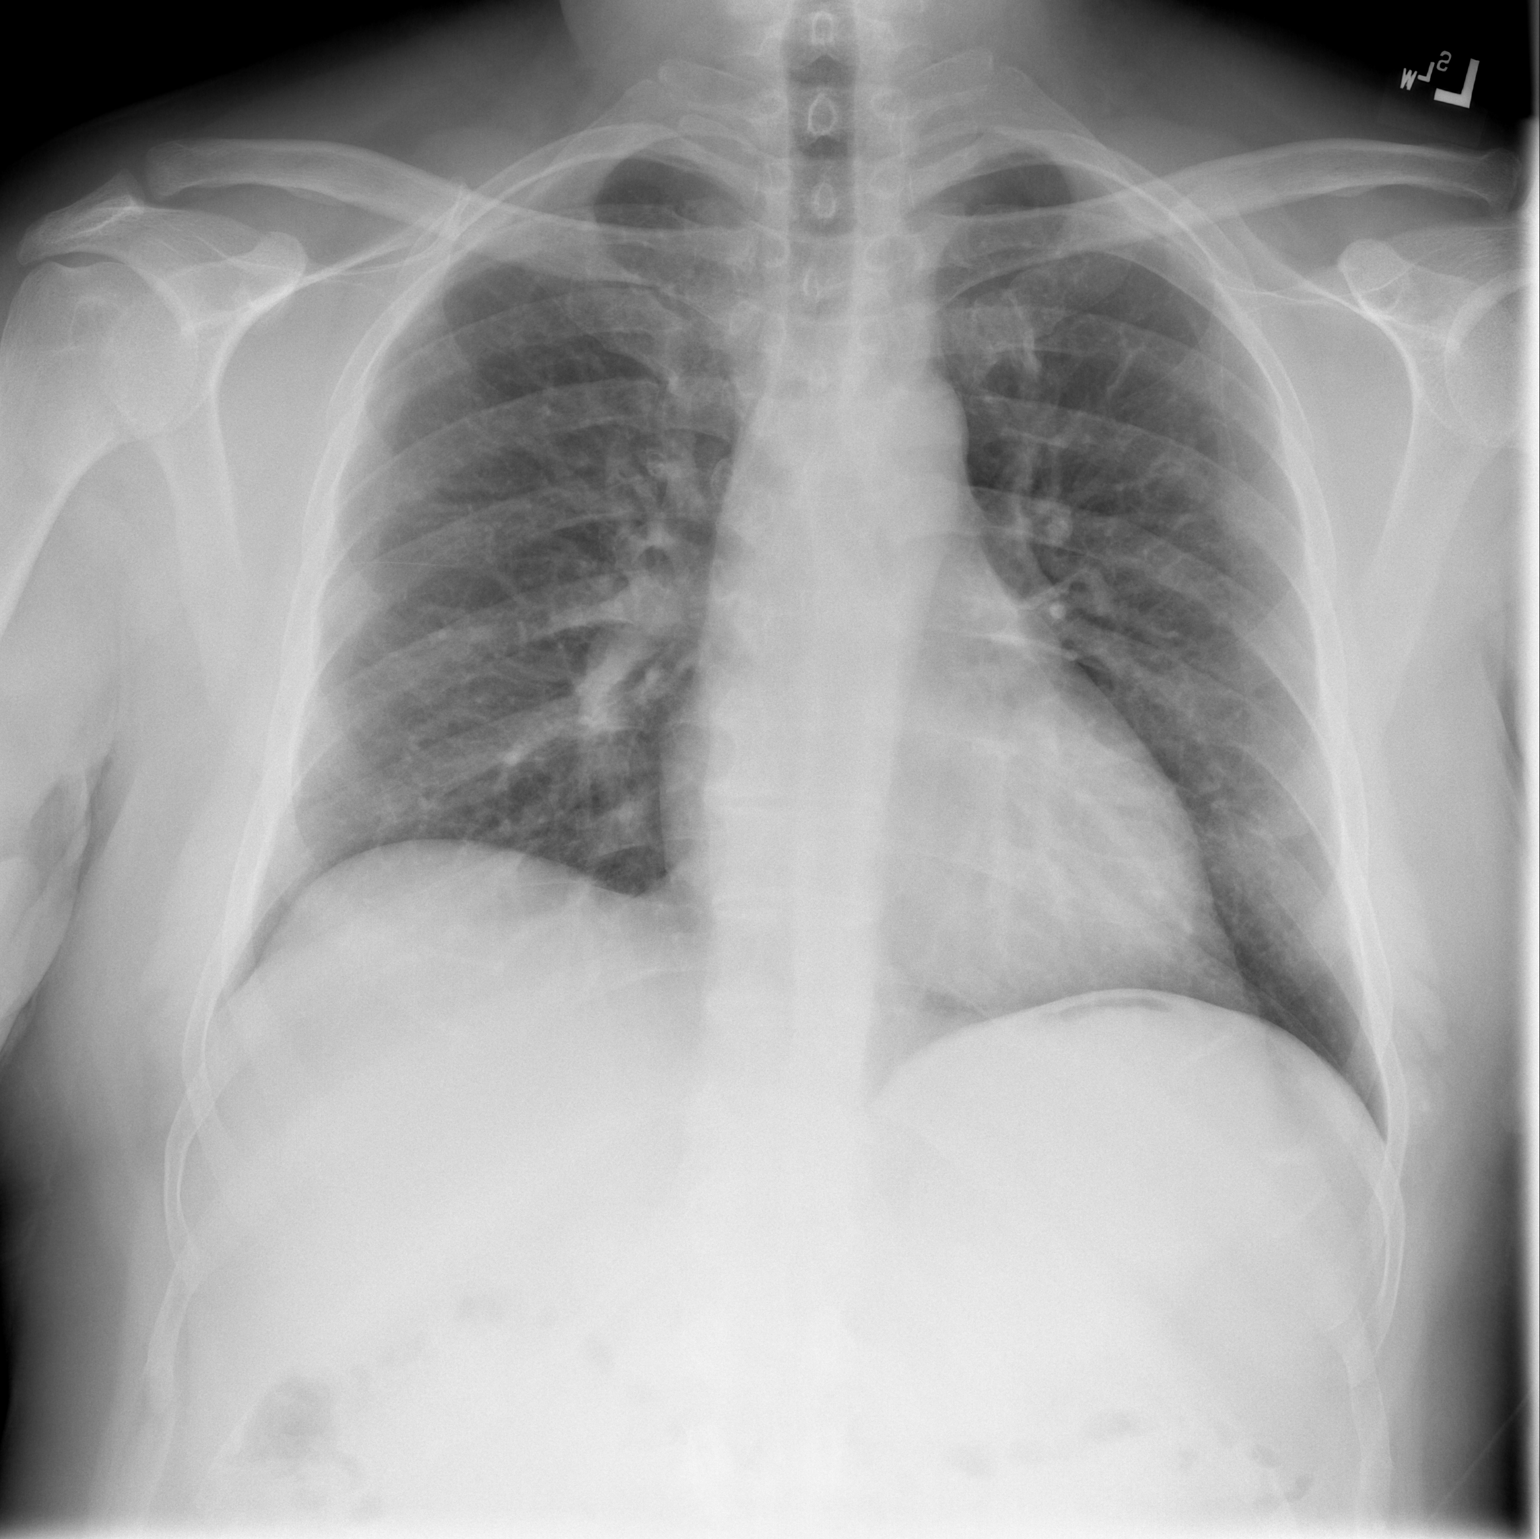

[w chest lat]
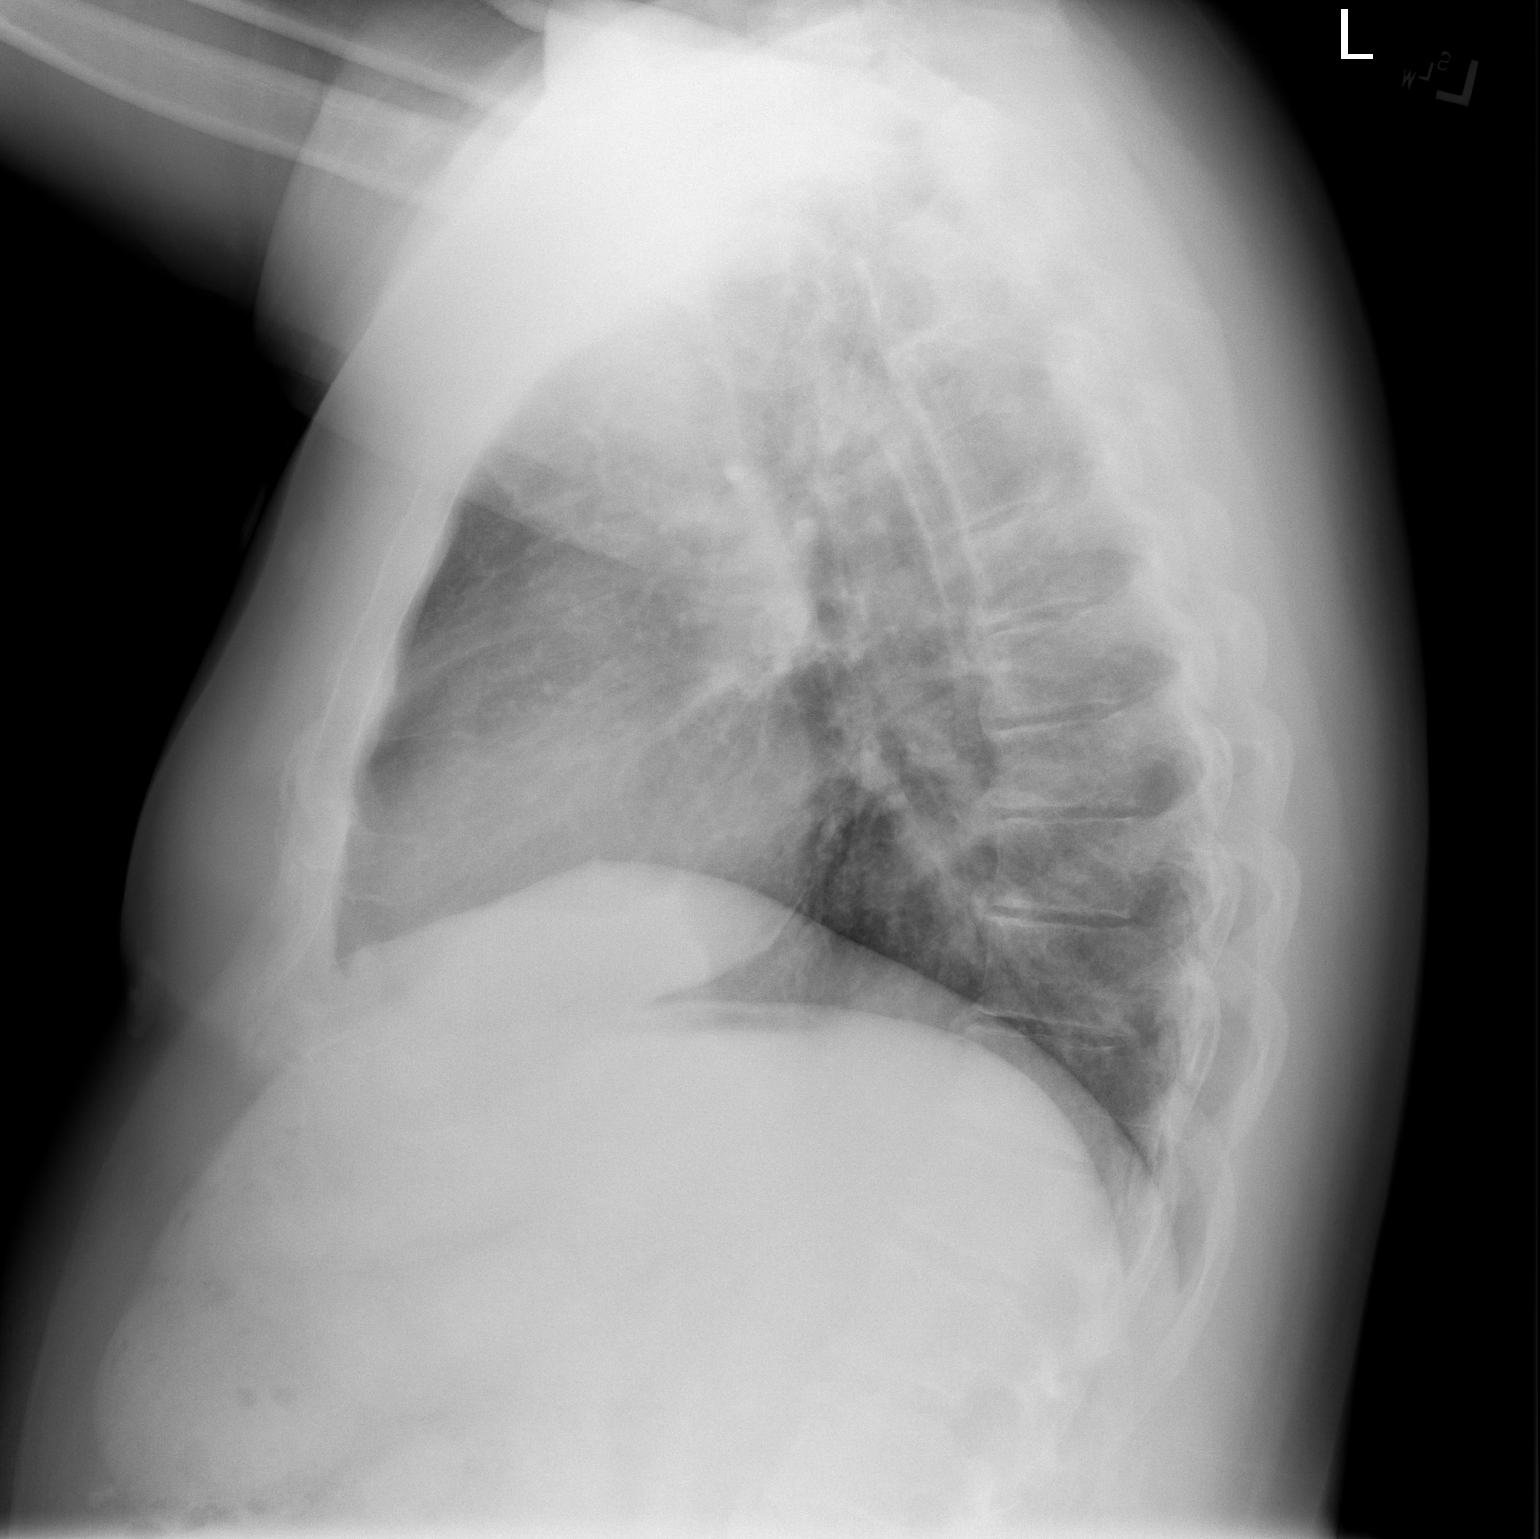

[2 of 2 positions shown; findings below may reference images not displayed]

FINDINGS: The heart size and mediastinal contours are within normal limits.
Both lungs are clear. The visualized skeletal structures are
unremarkable.
IMPRESSION: No active cardiopulmonary disease.

## 2020-12-05 ENCOUNTER — Ambulatory Visit (INDEPENDENT_AMBULATORY_CARE_PROVIDER_SITE_OTHER): Payer: BC Managed Care – PPO

## 2020-12-05 ENCOUNTER — Other Ambulatory Visit: Payer: Self-pay

## 2020-12-05 ENCOUNTER — Ambulatory Visit: Payer: BC Managed Care – PPO | Admitting: Podiatry

## 2020-12-05 DIAGNOSIS — M7751 Other enthesopathy of right foot: Secondary | ICD-10-CM

## 2020-12-05 DIAGNOSIS — G5761 Lesion of plantar nerve, right lower limb: Secondary | ICD-10-CM

## 2020-12-05 DIAGNOSIS — D3610 Benign neoplasm of peripheral nerves and autonomic nervous system, unspecified: Secondary | ICD-10-CM

## 2020-12-05 DIAGNOSIS — M898X7 Other specified disorders of bone, ankle and foot: Secondary | ICD-10-CM | POA: Diagnosis not present

## 2020-12-05 MED ORDER — DEXAMETHASONE SODIUM PHOSPHATE 120 MG/30ML IJ SOLN
2.0000 mg | Freq: Once | INTRAMUSCULAR | Status: AC
  Administered 2020-12-05: 2 mg via INTRA_ARTICULAR

## 2020-12-05 NOTE — Progress Notes (Deleted)
  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 Feels like a sock is balled up under pt foot.     55 y.o. male presents with the above complaint. History confirmed with patient. ***  Objective:  Physical Exam: {pe foot exam:315761::"warm, good capillary refill","normal sensory exam", "no trophic changes or ulcerative lesions","normal DP and PT pulses"}. Left Foot: {exam; foot:5774::"normal exam, no swelling, tenderness, instability; ligaments intact, full range of motion of all ankle/foot joints"}  Right Foot: {exam; foot:5774::"normal exam, no swelling, tenderness, instability; ligaments intact, full range of motion of all ankle/foot joints"}   No images are attached to the encounter.  Radiographs: X-ray of {right/left foot:16097}: {MP Foot XR:23762::"no fracture, dislocation, swelling or degenerative changes noted"} Assessment:   1. Capsulitis of metatarsophalangeal (MTP) joint of right foot   2. Pain in metatarsus of right foot      Plan:  Patient was evaluated and treated and all questions answered.  {Pod Diagnoses:23508} {Pod Plans:23486}  Return in about 1 month (around 01/05/2021) for Capsulitis.

## 2020-12-05 NOTE — Progress Notes (Signed)
  Subjective:  Patient ID: Troy Stuart, male    DOB: [birth year],  MRN: [id number]  Chief Complaint  Patient presents with   Neuroma    Right bottom foot neuroma between 2nd and 3rd met x 8 months. Feels like a sock is balled up under pt foot.    55 y.o. male presents with the above complaint. History confirmed with patient.   Objective:  Physical Exam: warm, good capillary refill, no trophic changes or ulcerative lesions, normal DP and PT pulses, and normal sensory exam.  Right Foot: POP 2nd MPJ, less so 2nd interspace, no plantar callosities noted. Negative mulder's click 8MQ/5TC interspace.   No images are attached to the encounter.  Radiographs: X-ray of the right foot: no fracture, dislocation, swelling or degenerative changes noted, elongated second metatarsal, and digital contractures Assessment:   1. Capsulitis of metatarsophalangeal (MTP) joint of right foot   2. Pain in metatarsus of right foot    Plan:  Patient was evaluated and treated and all questions answered.  Capsulitis -Educated on etiology -XR reviewed with patient -Injection delivered to the painful joint  Procedure: Joint Injection Location: Right 2nd MPJ joint Skin Prep: Alcohol. Injectate: 0.5 cc 1% lidocaine plain, 0.5 cc betamethasone acetate-betamethasone sodium phosphate Disposition: Patient tolerated procedure well. Injection site dressed with a band-aid.  Return in about 1 month (around 01/05/2021) for Capsulitis.

## 2021-01-09 ENCOUNTER — Ambulatory Visit: Payer: BC Managed Care – PPO | Admitting: Podiatry

## 2021-01-23 ENCOUNTER — Other Ambulatory Visit: Payer: Self-pay

## 2021-01-23 ENCOUNTER — Ambulatory Visit (INDEPENDENT_AMBULATORY_CARE_PROVIDER_SITE_OTHER): Payer: BC Managed Care – PPO | Admitting: Podiatry

## 2021-01-23 DIAGNOSIS — M898X7 Other specified disorders of bone, ankle and foot: Secondary | ICD-10-CM

## 2021-01-23 DIAGNOSIS — M7751 Other enthesopathy of right foot: Secondary | ICD-10-CM

## 2021-01-23 DIAGNOSIS — M2041 Other hammer toe(s) (acquired), right foot: Secondary | ICD-10-CM

## 2021-01-30 NOTE — Progress Notes (Signed)
  Subjective:  Patient ID: Troy Stuart, male    DOB: [birth year],  MRN: [id number]  Chief Complaint  Patient presents with   Foot Pain    Follow up right foot MTP. Pt states no change in pain.    55 y.o. male presents with the above complaint. History confirmed with patient. Thinks the foot is not doing any better and the injection did not help.  Objective:  Physical Exam: warm, good capillary refill, no trophic changes or ulcerative lesions, normal DP and PT pulses, and normal sensory exam.  Right Foot: POP 2nd MPJ, less so 2nd interspace, no plantar callosities noted. Negative mulder's click 8TM/1DQ interspace. Semi-rigid 2nd hammertoe deformity noted.  No images are attached to the encounter.  Radiographs: X-ray of the right foot: no fracture, dislocation, swelling or degenerative changes noted, elongated second metatarsal, and digital contractures Assessment:   1. Capsulitis of metatarsophalangeal (MTP) joint of right foot   2. Pain in metatarsus of right foot   3. Hammertoe of right foot    Plan:  Patient was evaluated and treated and all questions answered.  Capsulitis  -Injection did not appear to help. Hold off repeat.  -Reviewed XR again with patient. Discussed conservative vs surgical options. -Patient has failed all conservative therapy and wishes to proceed with surgical intervention. All risks, benefits, and alternatives discussed with patient. No guarantees given. Consent reviewed and signed by patient. -Planned procedures: right 2nd met shortening osteotomy, 2nd hammertoe correction with pin fixation. -ASA 2 - Patient with mild systemic disease with no functional limitations    No follow-ups on file.

## 2021-02-23 ENCOUNTER — Ambulatory Visit: Payer: BC Managed Care – PPO | Admitting: Podiatry

## 2021-02-23 ENCOUNTER — Other Ambulatory Visit: Payer: Self-pay

## 2021-02-23 DIAGNOSIS — M898X7 Other specified disorders of bone, ankle and foot: Secondary | ICD-10-CM

## 2021-02-23 DIAGNOSIS — M2041 Other hammer toe(s) (acquired), right foot: Secondary | ICD-10-CM

## 2021-02-23 DIAGNOSIS — M7751 Other enthesopathy of right foot: Secondary | ICD-10-CM | POA: Diagnosis not present

## 2021-02-23 NOTE — Progress Notes (Signed)
  Subjective:  Patient ID: Troy Stuart, male    DOB: [birth year],  MRN: [id number]  Chief Complaint  Patient presents with   Foot Pain    51mth fu Capsulitis   55y.o. male presents with the above complaint. History confirmed with patient. States the pain is unchanged and he still feels like he has a knot under his foot.  Objective:  Physical Exam: warm, good capillary refill, no trophic changes or ulcerative lesions, normal DP and PT pulses, and normal sensory exam.  Right Foot: POP 2nd MPJ, less so 2nd interspace, no plantar callosities noted. Negative mulder's click 22QM/2NOinterspace. Semi-rigid 2nd/3rd hammertoe deformity noted. Assessment:   1. Capsulitis of metatarsophalangeal (MTP) joint of right foot   2. Pain in metatarsus of right foot   3. Hammertoe of right foot     Plan:  Patient was evaluated and treated and all questions answered.  Capsulitis  -Unfortunately I think he will continue to have issues until surgery. We discussed taping the toe in plantarflexion to prevent the pain.  -Patient has failed conservative therapy and wishes to proceed with surgical intervention. All risks, benefits, and alternatives discussed with patient. No guarantees given. Consent reviewed and signed by patient. -Planned procedures: right 2nd met shortening osteotomy, 2nd/3rd hammertoe correction with pin fixation. -ASA 2 - Patient with mild systemic disease with no functional limitations    No follow-ups on file.

## 2021-04-22 HISTORY — PX: FOOT SURGERY: SHX648

## 2021-04-30 ENCOUNTER — Telehealth: Payer: Self-pay | Admitting: Urology

## 2021-04-30 NOTE — Telephone Encounter (Signed)
DOS - 05/30/21  METATARSAL OSTEOTOMY 2ND RIGHT --- 72902 HAMMERTOE REPAIR 2,3 RIGHT --- 11155   [employer] EFFECTIVE DATE - 09/20/20   PLAN DEDUCTIBLE - $5,000.00 W/ $5,000.00 REMAINING OUT OF POCKET - $6,600.00 W/ $2,080.22 REMAINING COINSURANCE - 20% COPAY - $0.00   NO PRIOR AUTH REQUIRED

## 2021-05-21 ENCOUNTER — Telehealth: Payer: Self-pay | Admitting: Podiatry

## 2021-05-21 NOTE — Telephone Encounter (Signed)
Pt is returning your call. Please call him on his number instead of his wife's number.

## 2021-06-05 ENCOUNTER — Encounter: Payer: BC Managed Care – PPO | Admitting: Podiatry

## 2021-06-06 ENCOUNTER — Other Ambulatory Visit: Payer: Self-pay

## 2021-06-06 ENCOUNTER — Ambulatory Visit (INDEPENDENT_AMBULATORY_CARE_PROVIDER_SITE_OTHER): Payer: BC Managed Care – PPO | Admitting: Podiatry

## 2021-06-06 ENCOUNTER — Other Ambulatory Visit: Payer: Self-pay | Admitting: Podiatry

## 2021-06-06 ENCOUNTER — Telehealth: Payer: Self-pay | Admitting: Podiatry

## 2021-06-06 ENCOUNTER — Telehealth: Payer: Self-pay | Admitting: *Deleted

## 2021-06-06 DIAGNOSIS — M21541 Acquired clubfoot, right foot: Secondary | ICD-10-CM | POA: Diagnosis not present

## 2021-06-06 DIAGNOSIS — M2041 Other hammer toe(s) (acquired), right foot: Secondary | ICD-10-CM | POA: Diagnosis not present

## 2021-06-06 DIAGNOSIS — Z9889 Other specified postprocedural states: Secondary | ICD-10-CM

## 2021-06-06 MED ORDER — CLINDAMYCIN HCL 150 MG PO CAPS: 150.0000 mg | ORAL_CAPSULE | Freq: Two times a day (BID) | ORAL | 0 refills | Status: DC

## 2021-06-06 MED ORDER — OXYCODONE-ACETAMINOPHEN 5-325 MG PO TABS: 1.0000 | ORAL_TABLET | ORAL | 0 refills | Status: DC | PRN

## 2021-06-06 MED ORDER — OXYCODONE HCL 10 MG PO TABS: 10.0000 mg | ORAL_TABLET | Freq: Four times a day (QID) | ORAL | 0 refills | Status: DC | PRN

## 2021-06-06 NOTE — Telephone Encounter (Deleted)
Patient's wife is calling because his pain is not responding to medication,has loosened the bandages and still in pain. Please advise.

## 2021-06-06 NOTE — Telephone Encounter (Signed)
Patient seen and evaluated.

## 2021-06-06 NOTE — Telephone Encounter (Signed)
Patient took one pain pill about 10am, an additional one an hour later. No relief at all. Patient has been icing since surgery. Will have patient come to the office to see if the dressing is too tight and causing pain.

## 2021-06-06 NOTE — Telephone Encounter (Signed)
Patient wife called and stated her husband had sx this morning. It felt like the wrapping was too tight. Wife unwrapped  everything except bandages at the sx site. She re-wrapped it softer and loosened the boot up. He has taken pain med's and he is still in severe pain.  Please call patient

## 2021-06-06 NOTE — Progress Notes (Signed)
Patient presented for urgent visit for intractable post-op pain. Has been taking his Percocet but nothing has touched the pain.   On exam, toes healthy and pink in appearance. Foot warm. Pins intact. Removed ACE bandage and superficial dressing layers. Scant bleeding on deep gauze.  After donning sterile gloves, sterile dressing change of deep layers. Patient reported decreased pain post-removal of interdigital gauze. Wound well appearing, sutures intact, no active drainage. Interdigital gauze not reapplied but new sterile dressing applied.   Increased pain rx to Oxycodone 10q4.  Will have patient keep f/u appt. Advised to call with further questions or concerns.  Evelina Bucy, DPM

## 2021-06-06 NOTE — Progress Notes (Signed)
Rx sent to pharmacy for outpatient surgery. °

## 2021-06-07 ENCOUNTER — Telehealth: Payer: Self-pay | Admitting: Podiatry

## 2021-06-07 MED ORDER — DOXYCYCLINE HYCLATE 100 MG PO TABS: 100.0000 mg | ORAL_TABLET | Freq: Two times a day (BID) | ORAL | 0 refills | Status: DC

## 2021-06-07 NOTE — Telephone Encounter (Signed)
Patient wife states that Troy Stuart started to take his antibiotic/pain meds. Wife stated that he got really sick on his stomach. Then another dose of antibiotic was taken and vomited again more than an hour later. So the wife gave him a benadryl so that he would sleep since he was in so much pain.

## 2021-06-07 NOTE — Addendum Note (Signed)
Addended by: Boneta Lucks on: 06/07/2021 02:13 PM   Modules accepted: Orders

## 2021-06-12 ENCOUNTER — Encounter: Payer: BC Managed Care – PPO | Admitting: Podiatry

## 2021-06-13 ENCOUNTER — Ambulatory Visit (INDEPENDENT_AMBULATORY_CARE_PROVIDER_SITE_OTHER): Payer: BC Managed Care – PPO

## 2021-06-13 ENCOUNTER — Ambulatory Visit (INDEPENDENT_AMBULATORY_CARE_PROVIDER_SITE_OTHER): Payer: BC Managed Care – PPO | Admitting: Podiatry

## 2021-06-13 ENCOUNTER — Other Ambulatory Visit: Payer: Self-pay

## 2021-06-13 DIAGNOSIS — Z9889 Other specified postprocedural states: Secondary | ICD-10-CM

## 2021-06-18 ENCOUNTER — Telehealth: Payer: Self-pay | Admitting: *Deleted

## 2021-06-18 NOTE — Telephone Encounter (Signed)
Patient is calling to ask if it is ok to drive. Please advise.

## 2021-06-18 NOTE — Telephone Encounter (Signed)
Do not recommend driving until next follow-up appointment thanks, Dr. Amalia Hailey

## 2021-06-19 ENCOUNTER — Encounter: Payer: BC Managed Care – PPO | Admitting: Podiatry

## 2021-06-19 NOTE — Progress Notes (Signed)
° °  Subjective:  Patient presents today status post hammertoe repair digits 2, 3 right foot. DOS: 06/06/2021 by Dr Hardie Pulley, no longer with our group.  Patient states that he is doing well he does have some pain between the toes.  He says that he needs no refills on the pain medications for the moment.  Past Medical History:  Diagnosis Date   Acid reflux    Depression    Hypertension    Suicidal ideations    Past Surgical History:  Procedure Laterality Date   HERNIA REPAIR     2002   SHOULDER ARTHROSCOPY     Allergies  Allergen Reactions   Penicillins Hives    .Marland KitchenHas patient had a PCN reaction causing immediate rash, facial/tongue/throat swelling, SOB or lightheadedness with hypotension: No Has patient had a PCN reaction causing severe rash involving mucus membranes or skin necrosis: No Has patient had a PCN reaction that required hospitalization No Has patient had a PCN reaction occurring within the last 10 years: No If all of the above answers are "NO", then may proceed with Cephalosporin use.     Objective/Physical Exam Neurovascular status intact.  Skin incisions appear to be well coapted with sutures intact. No sign of infectious process noted. No dehiscence. No active bleeding noted. Moderate edema noted to the surgical extremity.  Percutaneous fixation pins intact  Radiographic Exam:  Arthroplasty of the head of the proximal phalanx to the second third digit noted with percutaneous pins intact.  The toes are in a good rectus alignment.  Osteotomy stable to the second metatarsal with orthopedic screw intact.  Joint spaces preserved.  Assessment: 1. s/p hammertoe repair 2, 3 RT.  Weil shortening osteotomy second met RT.  Dr. Hardie Pulley. DOS: 06/06/2021.   Plan of Care:  1. Patient was evaluated. X-rays reviewed 2.  Dressings changed.  Clean dry and intact x1 week 3.  Continue minimal weightbearing in the cam boot 4.  Return to clinic in 1 week for suture  removal   Edrick Kins, DPM [facility]  Dr. Edrick Kins, DPM    [address], [place] [address]                Office [phone]  Fax [phone]

## 2021-06-19 NOTE — Telephone Encounter (Signed)
Pt called and was notified of Dr. Amalia Hailey response.

## 2021-06-26 ENCOUNTER — Encounter: Payer: BC Managed Care – PPO | Admitting: Podiatry

## 2021-06-27 ENCOUNTER — Other Ambulatory Visit: Payer: Self-pay

## 2021-06-27 ENCOUNTER — Ambulatory Visit (INDEPENDENT_AMBULATORY_CARE_PROVIDER_SITE_OTHER): Payer: BC Managed Care – PPO | Admitting: Podiatry

## 2021-06-27 DIAGNOSIS — Z9889 Other specified postprocedural states: Secondary | ICD-10-CM

## 2021-06-27 NOTE — Progress Notes (Addendum)
? ?  Subjective:  ?Patient presents today status post hammertoe repair digits 2, 3 right foot. DOS: 06/06/2021 by Dr Hardie Pulley, no longer with our group.  Patient continues to do well.  He has been weightbearing in the cam boot as tolerated.  No new complaints at this time ? ?Past Medical History:  ?Diagnosis Date  ? Acid reflux   ? Depression   ? Hypertension   ? Suicidal ideations   ? ?Past Surgical History:  ?Procedure Laterality Date  ? HERNIA REPAIR    ? 2002  ? SHOULDER ARTHROSCOPY    ? ?Allergies  ?Allergen Reactions  ? Penicillins Hives  ?  Marland KitchenMarland KitchenHas patient had a PCN reaction causing immediate rash, facial/tongue/throat swelling, SOB or lightheadedness with hypotension: No ?Has patient had a PCN reaction causing severe rash involving mucus membranes or skin necrosis: No ?Has patient had a PCN reaction that required hospitalization No ?Has patient had a PCN reaction occurring within the last 10 years: No ?If all of the above answers are "NO", then may proceed with Cephalosporin use. ?  ? ? ?Objective/Physical Exam ?Neurovascular status intact.  Skin incisions continue to appear to be well coapted with sutures intact. No sign of infectious process noted. No dehiscence. No active bleeding noted. Moderate edema noted to the surgical extremity.  Percutaneous fixation pins intact ? ?Radiographic Exam RT foot 06/13/2021:  ?Arthroplasty of the head of the proximal phalanx to the second third digit noted with percutaneous pins intact.  The toes are in a good rectus alignment.  Osteotomy stable to the second metatarsal with orthopedic screw intact.  Joint spaces preserved. ? ?Assessment: ?1. s/p hammertoe repair 2, 3 RT.  Weil shortening osteotomy second met RT.  Dr. Hardie Pulley. DOS: 06/06/2021. ? ? ?Plan of Care:  ?1. Patient was evaluated.  ?2.  Sutures removed ?3.  Discontinue cam boot.  Postsurgical shoe dispensed.  Weightbearing as tolerated ?4.  Return to clinic in 2 weeks for percutaneous pin removal and  follow-up x-rays ? ? ?Edrick Kins, DPM ?[place] ? ?Dr. Edrick Kins, DPM  ?  ?[address]                                    ?[place], [place] [address]                ?Office [phone]  ?Fax [phone] ? ? ? ? ? ?

## 2021-07-11 ENCOUNTER — Ambulatory Visit (INDEPENDENT_AMBULATORY_CARE_PROVIDER_SITE_OTHER): Payer: BC Managed Care – PPO | Admitting: Podiatry

## 2021-07-11 ENCOUNTER — Ambulatory Visit (INDEPENDENT_AMBULATORY_CARE_PROVIDER_SITE_OTHER): Payer: BC Managed Care – PPO

## 2021-07-11 ENCOUNTER — Other Ambulatory Visit: Payer: Self-pay

## 2021-07-11 DIAGNOSIS — Z9889 Other specified postprocedural states: Secondary | ICD-10-CM | POA: Diagnosis not present

## 2021-07-11 NOTE — Progress Notes (Signed)
? ?  Subjective:  ?Patient presents today status post hammertoe repair digits 2, 3 right foot. DOS: 06/06/2021 by Dr Hardie Pulley, no longer with our group.  Patient doing well.  He is weightbearing in the postsurgical shoe as instructed.  No new complaints at this time ? ?Past Medical History:  ?Diagnosis Date  ? Acid reflux   ? Depression   ? Hypertension   ? Suicidal ideations   ? ?Past Surgical History:  ?Procedure Laterality Date  ? HERNIA REPAIR    ? 2002  ? SHOULDER ARTHROSCOPY    ? ?Allergies  ?Allergen Reactions  ? Penicillins Hives  ?  Marland KitchenMarland KitchenHas patient had a PCN reaction causing immediate rash, facial/tongue/throat swelling, SOB or lightheadedness with hypotension: No ?Has patient had a PCN reaction causing severe rash involving mucus membranes or skin necrosis: No ?Has patient had a PCN reaction that required hospitalization No ?Has patient had a PCN reaction occurring within the last 10 years: No ?If all of the above answers are "NO", then may proceed with Cephalosporin use. ?  ? ? ?Objective/Physical Exam ?Neurovascular status intact.  Skin incisions healed.  Minimal edema noted.  Toes in a good rectus alignment.  No pain with palpation. ? ?Radiographic Exam RT foot 06/13/2021:  ?Arthroplasty of the head of the proximal phalanx to the second third digit noted with percutaneous pins intact.  The toes are in a good rectus alignment.  Osteotomy stable to the second metatarsal with orthopedic screw intact.  Joint spaces preserved. ? ?Assessment: ?1. s/p hammertoe repair 2, 3 RT.  Weil shortening osteotomy second met RT.  Dr. Hardie Pulley. DOS: 06/06/2021. ? ? ?Plan of Care:  ?1. Patient was evaluated.  ?2.  Percutaneous pins removed today ?3.  Patient may discontinue postsurgical shoe ?4.  Slowly increase activity over the next 4 weeks ?5.  Return to clinic as needed ? ? ?Edrick Kins, DPM ?[place] ? ?Dr. Edrick Kins, DPM  ?  ?[address]                                     ?[place], [place] [address]                ?Office [phone]  ?Fax [phone] ? ? ? ? ? ?

## 2021-07-26 NOTE — Telephone Encounter (Signed)
error 

## 2022-02-06 ENCOUNTER — Encounter (HOSPITAL_BASED_OUTPATIENT_CLINIC_OR_DEPARTMENT_OTHER): Payer: Self-pay | Admitting: Orthopaedic Surgery

## 2022-02-06 ENCOUNTER — Other Ambulatory Visit: Payer: Self-pay

## 2022-02-08 ENCOUNTER — Encounter (HOSPITAL_BASED_OUTPATIENT_CLINIC_OR_DEPARTMENT_OTHER)
Admission: RE | Admit: 2022-02-08 | Discharge: 2022-02-08 | Disposition: A | Discharge status: Home / Self Care | Payer: BC Managed Care – PPO | Source: Ambulatory Visit | Attending: Orthopaedic Surgery | Admitting: Orthopaedic Surgery

## 2022-02-08 DIAGNOSIS — Z01818 Encounter for other preprocedural examination: Secondary | ICD-10-CM | POA: Insufficient documentation

## 2022-02-08 DIAGNOSIS — I10 Essential (primary) hypertension: Secondary | ICD-10-CM | POA: Diagnosis not present

## 2022-02-08 LAB — BASIC METABOLIC PANEL
Anion gap: 9 (ref 5–15)
BUN: 22 mg/dL — ABNORMAL HIGH (ref 6–20)
CO2: 28 mmol/L (ref 22–32)
Calcium: 9 mg/dL (ref 8.9–10.3)
Chloride: 104 mmol/L (ref 98–111)
Creatinine, Ser: 1.09 mg/dL (ref 0.61–1.24)
GFR, Estimated: >60 mL/min (ref >60)
Glucose, Bld: 101 mg/dL — ABNORMAL HIGH (ref 70–99)
Potassium: 3.9 mmol/L (ref 3.5–5.1)
Sodium: 141 mmol/L (ref 135–145)

## 2022-02-08 NOTE — Progress Notes (Signed)
Surgical soap given with instructions, pt verbalized understanding.  Benzoyl peroxide gel given with written instructions, pt verbalized understanding.  

## 2022-02-12 ENCOUNTER — Ambulatory Visit: Payer: BC Managed Care – PPO | Admitting: Podiatry

## 2022-02-12 DIAGNOSIS — R52 Pain, unspecified: Secondary | ICD-10-CM

## 2022-02-12 DIAGNOSIS — G5761 Lesion of plantar nerve, right lower limb: Secondary | ICD-10-CM | POA: Diagnosis not present

## 2022-02-12 MED ORDER — BETAMETHASONE SOD PHOS & ACET 6 (3-3) MG/ML IJ SUSP: 3.0000 mg | Freq: Once | INTRAMUSCULAR | Status: DC

## 2022-02-12 NOTE — Progress Notes (Signed)
   Chief Complaint  Patient presents with   right foot pain    Patient is here for right foot pain, patient states that he feels like something is under te bottom of the foot.    HPI: 56 y.o. male presenting today for new complaint of pain and tenderness that has been ongoing for several months now to the interdigital areas of the right foot.  Patient states it feels as if he has cotton balls or gauze stuffed up underneath his toes.  This slowly began to develop after his surgery.  He does have a history of hammertoe repair to the second and third digits of the right foot.  DOS: 06/06/2021 by Dr. Hardie Pulley, no longer with our group.  He denies any recent injury or history of injury.  Presenting for further treatment and evaluation  Past Medical History:  Diagnosis Date   Acid reflux    Depression    Hypertension    Suicidal ideations     Past Surgical History:  Procedure Laterality Date   HERNIA REPAIR     2002   SHOULDER ARTHROSCOPY      Allergies  Allergen Reactions   Penicillins Hives    .Marland KitchenHas patient had a PCN reaction causing immediate rash, facial/tongue/throat swelling, SOB or lightheadedness with hypotension: No Has patient had a PCN reaction causing severe rash involving mucus membranes or skin necrosis: No Has patient had a PCN reaction that required hospitalization No Has patient had a PCN reaction occurring within the last 10 years: No If all of the above answers are "NO", then may proceed with Cephalosporin use.       Physical Exam: General: The patient is alert and oriented x3 in no acute distress.  Dermatology: Skin is warm, dry and supple bilateral lower extremities. Negative for open lesions or macerations.  Vascular: Palpable pedal pulses bilaterally. No edema or erythema noted. Capillary refill within normal limits.  Neurological: Epicritic and protective threshold grossly intact bilaterally.   Musculoskeletal Exam: Sharp pain with palpation of the  second intermetatarsal space as well as pain with lateral compression of the metatarsal heads consistent with neuroma.  Positive Conley Canal sign with loadbearing of the forefoot.  Assessment: 1.  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 Patient was evaluated.  2.  Injection of 0.5 cc Celestone Soluspan injected into the second intermetatarsal space right foot 3.  Patient is planning to have shoulder surgery in the next week.  Patient states that his surgeon is planning to prescribe him gabapentin which may help alleviate some of his neuroma symptoms 4.  Recommend wide fitting shoes that do not constrict the toebox area 5.  Recommend OTC arch supports to support the medial longitudinal arch of the foot and alleviate pressure from the forefoot 6.  Return to clinic as needed  Edrick Kins, DPM [facility]  Dr. Edrick Kins, DPM    [address], [place] [address]                Office [phone]  Fax [phone]

## 2022-02-14 ENCOUNTER — Ambulatory Visit (HOSPITAL_BASED_OUTPATIENT_CLINIC_OR_DEPARTMENT_OTHER): Payer: BC Managed Care – PPO | Admitting: Certified Registered"

## 2022-02-14 ENCOUNTER — Ambulatory Visit (HOSPITAL_BASED_OUTPATIENT_CLINIC_OR_DEPARTMENT_OTHER)
Admission: RE | Admit: 2022-02-14 | Discharge: 2022-02-14 | Disposition: A | Discharge status: Home / Self Care | Payer: BC Managed Care – PPO | Attending: Orthopaedic Surgery | Admitting: Orthopaedic Surgery

## 2022-02-14 ENCOUNTER — Encounter (HOSPITAL_BASED_OUTPATIENT_CLINIC_OR_DEPARTMENT_OTHER): Payer: Self-pay | Admitting: Orthopaedic Surgery

## 2022-02-14 ENCOUNTER — Encounter (HOSPITAL_BASED_OUTPATIENT_CLINIC_OR_DEPARTMENT_OTHER)
Admission: RE | Disposition: A | Discharge status: Home / Self Care | Payer: Self-pay | Source: Home / Self Care | Attending: Orthopaedic Surgery

## 2022-02-14 ENCOUNTER — Other Ambulatory Visit: Payer: Self-pay

## 2022-02-14 DIAGNOSIS — F32A Depression, unspecified: Secondary | ICD-10-CM | POA: Insufficient documentation

## 2022-02-14 DIAGNOSIS — Z79899 Other long term (current) drug therapy: Secondary | ICD-10-CM | POA: Diagnosis not present

## 2022-02-14 DIAGNOSIS — X58XXXA Exposure to other specified factors, initial encounter: Secondary | ICD-10-CM | POA: Diagnosis not present

## 2022-02-14 DIAGNOSIS — M7542 Impingement syndrome of left shoulder: Secondary | ICD-10-CM | POA: Diagnosis not present

## 2022-02-14 DIAGNOSIS — S43432A Superior glenoid labrum lesion of left shoulder, initial encounter: Secondary | ICD-10-CM | POA: Diagnosis not present

## 2022-02-14 DIAGNOSIS — M19012 Primary osteoarthritis, left shoulder: Secondary | ICD-10-CM | POA: Diagnosis not present

## 2022-02-14 DIAGNOSIS — S46012A Strain of muscle(s) and tendon(s) of the rotator cuff of left shoulder, initial encounter: Secondary | ICD-10-CM | POA: Diagnosis present

## 2022-02-14 DIAGNOSIS — K219 Gastro-esophageal reflux disease without esophagitis: Secondary | ICD-10-CM | POA: Insufficient documentation

## 2022-02-14 DIAGNOSIS — M7522 Bicipital tendinitis, left shoulder: Secondary | ICD-10-CM | POA: Diagnosis not present

## 2022-02-14 DIAGNOSIS — I10 Essential (primary) hypertension: Secondary | ICD-10-CM | POA: Diagnosis not present

## 2022-02-14 HISTORY — PX: SHOULDER ARTHROSCOPY WITH SUBACROMIAL DECOMPRESSION, ROTATOR CUFF REPAIR AND BICEP TENDON REPAIR: SHX5687

## 2022-02-14 HISTORY — DX: Sleep apnea, unspecified: G47.30

## 2022-02-14 SURGERY — SHOULDER ARTHROSCOPY WITH SUBACROMIAL DECOMPRESSION, ROTATOR CUFF REPAIR AND BICEP TENDON REPAIR
Anesthesia: General | Site: Shoulder | Laterality: Left

## 2022-02-14 MED ORDER — ONDANSETRON HCL 4 MG/2ML IJ SOLN: 4.0000 mg | Freq: Once | INTRAMUSCULAR | Status: DC | PRN

## 2022-02-14 MED ORDER — LACTATED RINGERS IV SOLN: INTRAVENOUS | Status: DC

## 2022-02-14 MED ORDER — GABAPENTIN 100 MG PO CAPS: 100.0000 mg | ORAL_CAPSULE | Freq: Three times a day (TID) | ORAL | 0 refills | Status: DC

## 2022-02-14 MED ORDER — PHENYLEPHRINE HCL-NACL 20-0.9 MG/250ML-% IV SOLN
INTRAVENOUS | Status: DC | PRN
  Administered 2022-02-14: 25 ug/min via INTRAVENOUS

## 2022-02-14 MED ORDER — FENTANYL CITRATE (PF) 100 MCG/2ML IJ SOLN
INTRAMUSCULAR | Status: AC
  Filled 2022-02-14: qty 2

## 2022-02-14 MED ORDER — PROPOFOL 10 MG/ML IV BOLUS
INTRAVENOUS | Status: DC | PRN
  Administered 2022-02-14: 140 mg via INTRAVENOUS

## 2022-02-14 MED ORDER — SODIUM CHLORIDE 0.9 % IR SOLN: Status: DC | PRN

## 2022-02-14 MED ORDER — OXYCODONE HCL 5 MG PO TABS: 5.0000 mg | ORAL_TABLET | Freq: Once | ORAL | Status: DC | PRN

## 2022-02-14 MED ORDER — ONDANSETRON HCL 4 MG/2ML IJ SOLN
INTRAMUSCULAR | Status: DC | PRN
  Administered 2022-02-14: 4 mg via INTRAVENOUS

## 2022-02-14 MED ORDER — TRANEXAMIC ACID-NACL 1000-0.7 MG/100ML-% IV SOLN
1000.0000 mg | INTRAVENOUS | Status: AC
  Administered 2022-02-14: 1000 mg via INTRAVENOUS

## 2022-02-14 MED ORDER — METHOCARBAMOL 500 MG PO TABS: 500.0000 mg | ORAL_TABLET | Freq: Three times a day (TID) | ORAL | 0 refills | Status: DC | PRN

## 2022-02-14 MED ORDER — CEFAZOLIN SODIUM-DEXTROSE 2-4 GM/100ML-% IV SOLN
2.0000 g | INTRAVENOUS | Status: AC
  Administered 2022-02-14: 2 g via INTRAVENOUS

## 2022-02-14 MED ORDER — DEXAMETHASONE SODIUM PHOSPHATE 10 MG/ML IJ SOLN
INTRAMUSCULAR | Status: AC
  Filled 2022-02-14: qty 1

## 2022-02-14 MED ORDER — ROCURONIUM BROMIDE 10 MG/ML (PF) SYRINGE
PREFILLED_SYRINGE | INTRAVENOUS | Status: AC
  Filled 2022-02-14: qty 10

## 2022-02-14 MED ORDER — GABAPENTIN 300 MG PO CAPS
ORAL_CAPSULE | ORAL | Status: AC
  Filled 2022-02-14: qty 1

## 2022-02-14 MED ORDER — ACETAMINOPHEN 500 MG PO TABS: 1000.0000 mg | ORAL_TABLET | Freq: Three times a day (TID) | ORAL | 0 refills | Status: AC

## 2022-02-14 MED ORDER — FENTANYL CITRATE (PF) 100 MCG/2ML IJ SOLN
100.0000 ug | Freq: Once | INTRAMUSCULAR | Status: AC
  Administered 2022-02-14: 100 ug via INTRAVENOUS

## 2022-02-14 MED ORDER — DICLOFENAC SODIUM 75 MG PO TBEC: 75.0000 mg | DELAYED_RELEASE_TABLET | Freq: Two times a day (BID) | ORAL | 0 refills | Status: DC

## 2022-02-14 MED ORDER — CEFAZOLIN SODIUM-DEXTROSE 2-4 GM/100ML-% IV SOLN
INTRAVENOUS | Status: AC
  Filled 2022-02-14: qty 100

## 2022-02-14 MED ORDER — LABETALOL HCL 5 MG/ML IV SOLN
INTRAVENOUS | Status: AC
  Filled 2022-02-14: qty 4

## 2022-02-14 MED ORDER — BUPIVACAINE HCL (PF) 0.5 % IJ SOLN
INTRAMUSCULAR | Status: DC | PRN
  Administered 2022-02-14: 20 mL via PERINEURAL

## 2022-02-14 MED ORDER — ONDANSETRON HCL 4 MG PO TABS: 4.0000 mg | ORAL_TABLET | Freq: Three times a day (TID) | ORAL | 0 refills | Status: AC | PRN

## 2022-02-14 MED ORDER — LABETALOL HCL 5 MG/ML IV SOLN
5.0000 mg | INTRAVENOUS | Status: DC | PRN
  Administered 2022-02-14 (×3): 5 mg via INTRAVENOUS

## 2022-02-14 MED ORDER — FENTANYL CITRATE (PF) 100 MCG/2ML IJ SOLN
INTRAMUSCULAR | Status: DC | PRN
  Administered 2022-02-14: 50 ug via INTRAVENOUS

## 2022-02-14 MED ORDER — ACETAMINOPHEN 500 MG PO TABS
1000.0000 mg | ORAL_TABLET | Freq: Once | ORAL | Status: AC
  Administered 2022-02-14: 1000 mg via ORAL

## 2022-02-14 MED ORDER — PROPOFOL 10 MG/ML IV BOLUS
INTRAVENOUS | Status: AC
  Filled 2022-02-14: qty 20

## 2022-02-14 MED ORDER — CELECOXIB 100 MG PO CAPS: 100.0000 mg | ORAL_CAPSULE | Freq: Two times a day (BID) | ORAL | 0 refills | Status: DC

## 2022-02-14 MED ORDER — HYDROMORPHONE HCL 1 MG/ML IJ SOLN: 0.2500 mg | INTRAMUSCULAR | Status: DC | PRN

## 2022-02-14 MED ORDER — MIDAZOLAM HCL 2 MG/2ML IJ SOLN
INTRAMUSCULAR | Status: AC
  Filled 2022-02-14: qty 2

## 2022-02-14 MED ORDER — BUPIVACAINE LIPOSOME 1.3 % IJ SUSP
INTRAMUSCULAR | Status: DC | PRN
  Administered 2022-02-14: 10 mL via PERINEURAL

## 2022-02-14 MED ORDER — GABAPENTIN 300 MG PO CAPS
300.0000 mg | ORAL_CAPSULE | Freq: Once | ORAL | Status: AC
  Administered 2022-02-14: 300 mg via ORAL

## 2022-02-14 MED ORDER — LIDOCAINE HCL (CARDIAC) PF 100 MG/5ML IV SOSY
PREFILLED_SYRINGE | INTRAVENOUS | Status: DC | PRN
  Administered 2022-02-14: 40 mg via INTRAVENOUS

## 2022-02-14 MED ORDER — ACETAMINOPHEN 500 MG PO TABS
ORAL_TABLET | ORAL | Status: AC
  Filled 2022-02-14: qty 2

## 2022-02-14 MED ORDER — ONDANSETRON HCL 4 MG/2ML IJ SOLN
INTRAMUSCULAR | Status: AC
  Filled 2022-02-14: qty 2

## 2022-02-14 MED ORDER — DEXAMETHASONE SODIUM PHOSPHATE 4 MG/ML IJ SOLN
INTRAMUSCULAR | Status: DC | PRN
  Administered 2022-02-14: 10 mg via INTRAVENOUS

## 2022-02-14 MED ORDER — PHENYLEPHRINE HCL (PRESSORS) 10 MG/ML IV SOLN
INTRAVENOUS | Status: AC
  Filled 2022-02-14: qty 1

## 2022-02-14 MED ORDER — SUGAMMADEX SODIUM 200 MG/2ML IV SOLN
INTRAVENOUS | Status: DC | PRN
  Administered 2022-02-14: 200 mg via INTRAVENOUS

## 2022-02-14 MED ORDER — LIDOCAINE 2% (20 MG/ML) 5 ML SYRINGE
INTRAMUSCULAR | Status: AC
  Filled 2022-02-14: qty 5

## 2022-02-14 MED ORDER — MIDAZOLAM HCL 2 MG/2ML IJ SOLN
2.0000 mg | Freq: Once | INTRAMUSCULAR | Status: AC
  Administered 2022-02-14: 2 mg via INTRAVENOUS

## 2022-02-14 MED ORDER — KETOROLAC TROMETHAMINE 30 MG/ML IJ SOLN: 30.0000 mg | Freq: Once | INTRAMUSCULAR | Status: DC | PRN

## 2022-02-14 MED ORDER — ROCURONIUM BROMIDE 100 MG/10ML IV SOLN
INTRAVENOUS | Status: DC | PRN
  Administered 2022-02-14: 50 mg via INTRAVENOUS

## 2022-02-14 MED ORDER — OXYCODONE HCL 5 MG PO TABS: ORAL_TABLET | ORAL | 0 refills | Status: AC

## 2022-02-14 MED ORDER — TRANEXAMIC ACID-NACL 1000-0.7 MG/100ML-% IV SOLN
INTRAVENOUS | Status: AC
  Filled 2022-02-14: qty 100

## 2022-02-14 MED ORDER — OXYCODONE HCL 5 MG/5ML PO SOLN: 5.0000 mg | Freq: Once | ORAL | Status: DC | PRN

## 2022-02-14 SURGICAL SUPPLY — 58 items
ANCHOR FIBERTAK RC 2.6 (BLUE) (Anchor) IMPLANT
ANCHOR SUT 1.8 FIBERTAK SB KL (Anchor) IMPLANT
ANCHOR SUT BIO SW 4.75X19.1 (Anchor) IMPLANT
ANCHOR SUT FBRTK 2.6X1.7X2 (Anchor) IMPLANT
BLADE EXCALIBUR 4.0X13 (MISCELLANEOUS) ×1 IMPLANT
BLADE SURG 10 STRL SS (BLADE) IMPLANT
BURR OVAL 8 FLU 4.0X13 (MISCELLANEOUS) IMPLANT
CANNULA 5.75X71 LONG (CANNULA) IMPLANT
CANNULA PASSPORT 5 (CANNULA) IMPLANT
CANNULA PASSPORT BUTTON 10-40 (CANNULA) IMPLANT
CANNULA TWIST IN 8.25X7CM (CANNULA) IMPLANT
CHLORAPREP W/TINT 26 (MISCELLANEOUS) ×1 IMPLANT
CLSR STERI-STRIP ANTIMIC 1/2X4 (GAUZE/BANDAGES/DRESSINGS) ×1 IMPLANT
COOLER ICEMAN CLASSIC (MISCELLANEOUS) ×1 IMPLANT
DRAPE IMP U-DRAPE 54X76 (DRAPES) ×1 IMPLANT
DRAPE INCISE IOBAN 66X45 STRL (DRAPES) IMPLANT
DRAPE POUCH INSTRU U-SHP 10X18 (DRAPES) ×1 IMPLANT
DRAPE SHOULDER BEACH CHAIR (DRAPES) ×1 IMPLANT
DW OUTFLOW CASSETTE/TUBE SET (MISCELLANEOUS) ×1 IMPLANT
GAUZE PAD ABD 8X10 STRL (GAUZE/BANDAGES/DRESSINGS) ×1 IMPLANT
GAUZE SPONGE 4X4 12PLY STRL (GAUZE/BANDAGES/DRESSINGS) ×1 IMPLANT
GLOVE BIO SURGEON STRL SZ 6.5 (GLOVE) ×1 IMPLANT
GLOVE BIOGEL PI IND STRL 6.5 (GLOVE) ×1 IMPLANT
GLOVE BIOGEL PI IND STRL 8 (GLOVE) ×1 IMPLANT
GLOVE ECLIPSE 8.0 STRL XLNG CF (GLOVE) ×1 IMPLANT
GOWN STRL REUS W/ TWL LRG LVL3 (GOWN DISPOSABLE) ×2 IMPLANT
GOWN STRL REUS W/TWL LRG LVL3 (GOWN DISPOSABLE) ×1
GOWN STRL REUS W/TWL XL LVL3 (GOWN DISPOSABLE) ×1 IMPLANT
IMPL FIBERTAK KNTLS 2.6 (Anchor) IMPLANT
IMPLANT FIBERTAK KNTLS 2.6 (Anchor) ×1 IMPLANT
KIT STABILIZATION SHOULDER (MISCELLANEOUS) ×1 IMPLANT
KIT STR SPEAR 1.8 FBRTK DISP (KITS) IMPLANT
LASSO CRESCENT QUICKPASS (SUTURE) IMPLANT
MANIFOLD NEPTUNE II (INSTRUMENTS) ×1 IMPLANT
NDL SAFETY ECLIP 18X1.5 (MISCELLANEOUS) ×1 IMPLANT
NDL SCORPION MULTI FIRE (NEEDLE) IMPLANT
NEEDLE SCORPION MULTI FIRE (NEEDLE) ×1 IMPLANT
PACK ARTHROSCOPY DSU (CUSTOM PROCEDURE TRAY) ×1 IMPLANT
PACK BASIN DAY SURGERY FS (CUSTOM PROCEDURE TRAY) ×1 IMPLANT
PAD COLD SHLDR WRAP-ON (PAD) ×1 IMPLANT
PORT APPOLLO RF 90DEGREE MULTI (SURGICAL WAND) ×1 IMPLANT
RESTRAINT HEAD UNIVERSAL NS (MISCELLANEOUS) ×1 IMPLANT
SHEET MEDIUM DRAPE 40X70 STRL (DRAPES) IMPLANT
SLEEVE SCD COMPRESS KNEE MED (STOCKING) ×1 IMPLANT
SLING ARM FOAM STRAP LRG (SOFTGOODS) IMPLANT
SUT FIBERWIRE #2 38 T-5 BLUE (SUTURE)
SUT MNCRL AB 4-0 PS2 18 (SUTURE) ×1 IMPLANT
SUT PDS AB 1 CT  36 (SUTURE)
SUT PDS AB 1 CT 36 (SUTURE) IMPLANT
SUT TIGER TAPE 7 IN WHITE (SUTURE) IMPLANT
SUTURE FIBERWR #2 38 T-5 BLUE (SUTURE) IMPLANT
SUTURE TAPE TIGERLINK 1.3MM BL (SUTURE) IMPLANT
SUTURETAPE TIGERLINK 1.3MM BL (SUTURE) ×1
SYR 5ML LL (SYRINGE) ×1 IMPLANT
TAPE FIBER 2MM 7IN #2 BLUE (SUTURE) IMPLANT
TOWEL GREEN STERILE FF (TOWEL DISPOSABLE) ×2 IMPLANT
TUBE CONNECTING 20X1/4 (TUBING) ×1 IMPLANT
TUBING ARTHROSCOPY IRRIG 16FT (MISCELLANEOUS) ×1 IMPLANT

## 2022-02-14 NOTE — Transfer of Care (Signed)
Immediate Anesthesia Transfer of Care Note  Patient: Troy Stuart  Procedure(s) Performed: SHOULDER ARTHROSCOPY WITH SUBACROMIAL DECOMPRESSION, ROTATOR CUFF REPAIR AND BICEP TENDON REPAIR (Left: Shoulder)  Patient Location: PACU  Anesthesia Type:General  Level of Consciousness: drowsy, patient cooperative and responds to stimulation  Airway & Oxygen Therapy: Patient Spontanous Breathing and Patient connected to face mask oxygen  Post-op Assessment: Report given to RN and Post -op Vital signs reviewed and stable  Post vital signs: Reviewed and stable  Last Vitals:  Vitals Value Taken Time  BP    Temp    Pulse 94 02/14/22 1210  Resp    SpO2 97 % 02/14/22 1210  Vitals shown include unvalidated device data.  Last Pain:  Vitals:   02/14/22 0901  TempSrc: Oral  PainSc: 7       Patients Stated Pain Goal: 6 (01/09/79 2217)  Complications: No notable events documented.

## 2022-02-14 NOTE — Addendum Note (Signed)
Addendum  created 02/14/22 1320 by Roderic Palau, MD   Order list changed, Pharmacy for encounter modified

## 2022-02-14 NOTE — Progress Notes (Signed)
Assisted Dr. Kalman Shan with left, interscalene , ultrasound guided block. Side rails up, monitors on throughout procedure. See vital signs in flow sheet. Tolerated Procedure well.

## 2022-02-14 NOTE — Discharge Instructions (Addendum)
Post Anesthesia Home Care Instructions  Activity: Get plenty of rest for the remainder of the day. A responsible individual must stay with you for 24 hours following the procedure.  For the next 24 hours, DO NOT: -Drive a car -Paediatric nurse -Drink alcoholic beverages -Take any medication unless instructed by your physician -Make any legal decisions or sign important papers.  Meals: Start with liquid foods such as gelatin or soup. Progress to regular foods as tolerated. Avoid greasy, spicy, heavy foods. If nausea and/or vomiting occur, drink only clear liquids until the nausea and/or vomiting subsides. Call your physician if vomiting continues.  Special Instructions/Symptoms: Your throat may feel dry or sore from the anesthesia or the breathing tube placed in your throat during surgery. If this causes discomfort, gargle with warm salt water. The discomfort should disappear within 24 hours.      Regional Anesthesia Blocks  1. Numbness or the inability to move the "blocked" extremity may last from 3-48 hours after placement. The length of time depends on the medication injected and your individual response to the medication. If the numbness is not going away after 48 hours, call your surgeon.  2. The extremity that is blocked will need to be protected until the numbness is gone and the  Strength has returned. Because you cannot feel it, you will need to take extra care to avoid injury. Because it may be weak, you may have difficulty moving it or using it. You may not know what position it is in without looking at it while the block is in effect.  3. For blocks in the legs and feet, returning to weight bearing and walking needs to be done carefully. You will need to wait until the numbness is entirely gone and the strength has returned. You should be able to move your leg and foot normally before you try and bear weight or walk. You will need someone to be with you when you first try to  ensure you do not fall and possibly risk injury.  4. Bruising and tenderness at the needle site are common side effects and will resolve in a few days.  5. Persistent numbness or new problems with movement should be communicated to the surgeon or the Walker [phone] [place] [phone]).   Information for Discharge Teaching: EXPAREL (bupivacaine liposome injectable suspension)   Your surgeon or anesthesiologist gave you EXPAREL(bupivacaine) to help control your pain after surgery.  EXPAREL is a local anesthetic that provides pain relief by numbing the tissue around the surgical site. EXPAREL is designed to release pain medication over time and can control pain for up to 72 hours. Depending on how you respond to EXPAREL, you may require less pain medication during your recovery.  Possible side effects: Temporary loss of sensation or ability to move in the area where bupivacaine was injected. Nausea, vomiting, constipation Rarely, numbness and tingling in your mouth or lips, lightheadedness, or anxiety may occur. Call your doctor right away if you think you may be experiencing any of these sensations, or if you have other questions regarding possible side effects.  Follow all other discharge instructions given to you by your surgeon or nurse. Eat a healthy diet and drink plenty of water or other fluids.  If you return to the hospital for any reason within 96 hours following the administration of EXPAREL, it is important for health care providers to know that you have received this anesthetic. A teal colored band  has been placed on your arm with the date, time and amount of EXPAREL you have received in order to alert and inform your health care providers. Please leave this armband in place for the full 96 hours following administration, and then you may remove the band.  Donjoy Ultrasling III (Red ball):  Please contact your surgeon if you have  questions or concerns about your sling.      Ophelia Charter MD, MPH Noemi Chapel, PA-C [place] [address] [phone] (tel)   [phone] (fax)   POST-OPERATIVE INSTRUCTIONS - SHOULDER ARTHROSCOPY  WOUND CARE You may remove the Operative Dressing on Post-Op Day #3 (72hrs after surgery).   Alternatively if you would like you can leave dressing on until follow-up if within 7-8 days but keep it dry. Leave steri-strips in place until they fall off on their own, usually 2 weeks postop. There may be a small amount of fluid/bleeding leaking at the surgical site.  This is normal; the shoulder is filled with fluid during the procedure and can leak for 24-48hrs after surgery.  You may change/reinforce the bandage as needed.  Use the Cryocuff or Ice as often as possible for the first 7 days, then as needed for pain relief. Always keep a towel, ACE wrap or other barrier between the cooling unit and your skin.  You may shower on Post-Op Day #3. Gently pat the area dry.  Do not soak the shoulder in water or submerge it.  Keep incisions as dry as possible. Do not go swimming in the pool or ocean until 4 weeks after surgery or when otherwise instructed.    EXERCISES Wear the sling at all times  You may remove the sling for showering, but keep the arm across the chest or in a secondary sling.     It is normal for your fingers/hand to become more swollen after surgery and discolored from bruising.   This will resolve over the first few weeks usually after surgery. Please continue to ambulate and do not stay sitting or lying for too long.  Perform foot and wrist pumps to assist in circulation.  PHYSICAL THERAPY - You will begin physical therapy soon after surgery (unless otherwise specified) - Please call to set up an appointment, if you do not already have one  - Let our office if there are any issues with scheduling your therapy  - You have a physical  therapy appointment scheduled at [facility] (across the hall from our office) on Tuesday, 10/31 '@4PM'$    REGIONAL ANESTHESIA ([place]) The anesthesia team may have performed a nerve block for you this is a great tool used to minimize pain.   The block may start wearing off overnight (between 8-24 hours postop) When the block wears off, your pain may go from nearly zero to the pain you would have had postop without the block. This is an abrupt transition but nothing dangerous is happening.   This can be a challenging period but utilize your as needed pain medications to try and manage this period. We suggest you use the pain medication the first night prior to going to bed, to ease this transition.  You may take an extra dose of narcotic when this happens if needed  POST-OP MEDICATIONS- Multimodal approach to pain control In general your pain will be controlled with a combination of substances.  Prescriptions unless otherwise discussed are electronically sent to your pharmacy.  This is a carefully made plan  we use to minimize narcotic use.     Diclofenac - Anti-inflammatory medication taken on a scheduled basis Take 1 tablet twice a day Acetaminophen - Non-narcotic pain medicine taken on a scheduled basis  Take two 500 mg tablets (1,000 mg total) every 8 hours MAY HAVE ANOTHER DOSE AFTER 1:15PM, Gabapentin - this is a medication to help with nerve pain, take on a scheduled basis - Take one tablet three times a day Oxycodone - This is a strong narcotic, to be used only on an "as needed" basis for SEVERE pain. Take 1-2 tablets every 6 hours as needed for severe pain only Zofran - take as needed for nausea Robaxin - this is a muscle relaxer, take as needed for muscle spasms Take one tablet every 8 hours as needed for muscle spasms Be cautious with taking closely to pain medications and benzodiazepines    FOLLOW-UP If you develop a Fever (?101.5), Redness or Drainage from the surgical  incision site, please call our office to arrange for an evaluation. Please call the office to schedule a follow-up appointment for your first post-operative appointment, 7-10 days post-operatively.    HELPFUL INFORMATION   You may be more comfortable sleeping in a semi-seated position the first few nights following surgery.  Keep a pillow propped under the elbow and forearm for comfort.  If you have a recliner type of chair it might be beneficial.  If not that is fine too, but it would be helpful to sleep propped up with pillows behind your operated shoulder as well under your elbow and forearm.  This will reduce pulling on the suture lines.  When dressing, put your operative arm in the sleeve first.  When getting undressed, take your operative arm out last.  Loose fitting, button-down shirts are recommended.  Often in the first days after surgery you may be more comfortable keeping your operative arm under your shirt and not through the sleeve.  You may return to work/school in the next couple of days when you feel up to it.  Desk work and typing in the sling is fine.  We suggest you use the pain medication the first night prior to going to bed, in order to ease any pain when the anesthesia wears off. You should avoid taking pain medications on an empty stomach as it will make you nauseous.  You should wean off your narcotic medicines as soon as you are able.  Most patients will be off or using minimal narcotics before their first postop appointment.   Do not drink alcoholic beverages or take illicit drugs when taking pain medications.  It is against the law to drive while taking narcotics.  In some states it is against the law to drive while your arm is in a sling.   Pain medication may make you constipated.  Below are a few solutions to try in this order: Decrease the amount of pain medication if you aren't having pain. Drink lots of decaffeinated fluids. Drink prune juice and/or eat  dried prunes  If the first 3 don't work start with additional solutions Take Colace - an over-the-counter stool softener Take Senokot - an over-the-counter laxative Take Miralax - a stronger over-the-counter laxative  For more information including helpful videos and documents visit our website:   [email or web address]

## 2022-02-14 NOTE — Anesthesia Postprocedure Evaluation (Signed)
Anesthesia Post Note  Patient: Hall Birchard  Procedure(s) Performed: SHOULDER ARTHROSCOPY WITH SUBACROMIAL DECOMPRESSION, ROTATOR CUFF REPAIR AND BICEP TENDON REPAIR (Left: Shoulder)     Patient location during evaluation: PACU Anesthesia Type: General and Regional Level of consciousness: awake and alert Pain management: pain level controlled Vital Signs Assessment: post-procedure vital signs reviewed and stable Respiratory status: spontaneous breathing, nonlabored ventilation and respiratory function stable Cardiovascular status: blood pressure returned to baseline and stable Postop Assessment: no apparent nausea or vomiting Anesthetic complications: no   No notable events documented.  Last Vitals:  Vitals:   02/14/22 1245 02/14/22 1300  BP: (!) 143/103 (!) 135/90  Pulse: 85 84  Resp: 12 17  Temp:    SpO2: 94% 93%    Last Pain:  Vitals:   02/14/22 1300  TempSrc:   PainSc: 0-No pain                 Giovonni Poirier,W. EDMOND

## 2022-02-14 NOTE — Anesthesia Procedure Notes (Signed)
Anesthesia Regional Block: Interscalene brachial plexus block   Pre-Anesthetic Checklist: , timeout performed,  Correct Patient, Correct Site, Correct Laterality,  Correct Procedure, Correct Position, site marked,  Risks and benefits discussed,  Surgical consent,  Pre-op evaluation,  At surgeon's request and post-op pain management  Laterality: Left  Prep: chloraprep       Needles:  Injection technique: Single-shot  Needle Type: Echogenic Stimulator Needle     Needle Length: 9cm      Additional Needles:   Procedures:,,,, ultrasound used (permanent image in chart),,     Nerve Stimulator or Paresthesia:  Response: 0.47 mA  Additional Responses:   Narrative:  Start time: 02/14/2022 9:40 AM End time: 02/14/2022 9:50 AM Injection made incrementally with aspirations every 5 mL.  Performed by: Personally  Anesthesiologist: Myrtie Soman, MD  Additional Notes: Patient tolerated the procedure well without complications

## 2022-02-14 NOTE — Anesthesia Procedure Notes (Signed)
Anesthesia Procedure Image    

## 2022-02-14 NOTE — H&P (Signed)
PREOPERATIVE H&P  Chief Complaint: left shoulder cartilage disorder, impingement syndrome, bicep tendinitis, rotator cuff tear.  HPI: Troy Stuart is a 56 y.o. male who is scheduled for, Procedure(s): SHOULDER ARTHROSCOPY WITH SUBACROMIAL DECOMPRESSION, ROTATOR CUFF REPAIR AND BICEP TENDON REPAIR.   Patient has a past medical history significant for GERD, HTN.   Patient has had left shoulder pain for quite some time. He has limitations with motion. He feels weak in the left side. He is frustrated by the function of his left shoulder.   Symptoms are rated as moderate to severe, and have been worsening.  This is significantly impairing activities of daily living.    Please see clinic note for further details on this patient's care.    He has elected for surgical management.   Past Medical History:  Diagnosis Date   Acid reflux    Depression    Hypertension    Suicidal ideations    Past Surgical History:  Procedure Laterality Date   HERNIA REPAIR     2002   SHOULDER ARTHROSCOPY     Social History   Socioeconomic History   Marital status: Married    Spouse name: Not on file   Number of children: Not on file   Years of education: Not on file   Highest education level: Not on file  Occupational History   Not on file  Tobacco Use   Smoking status: Never   Smokeless tobacco: Never  Substance and Sexual Activity   Alcohol use: No   Drug use: No   Sexual activity: Yes    Birth control/protection: None  Other Topics Concern   Not on file  Social History Narrative   Not on file   Social Determinants of Health   Financial Resource Strain: Not on file  Food Insecurity: Not on file  Transportation Needs: Not on file  Physical Activity: Not on file  Stress: Not on file  Social Connections: Not on file   Family History  Problem Relation Age of Onset   Diabetes Mother    Mental illness Father    Depression Brother    Allergies  Allergen Reactions    Penicillins Hives    .Marland KitchenHas patient had a PCN reaction causing immediate rash, facial/tongue/throat swelling, SOB or lightheadedness with hypotension: No Has patient had a PCN reaction causing severe rash involving mucus membranes or skin necrosis: No Has patient had a PCN reaction that required hospitalization No Has patient had a PCN reaction occurring within the last 10 years: No If all of the above answers are "NO", then may proceed with Cephalosporin use.    Prior to Admission medications   Medication Sig Start Date End Date Taking? Authorizing Provider  buPROPion (WELLBUTRIN XL) 300 MG 24 hr tablet Take 1 tablet by mouth daily. 12/10/18  Yes [provider]  clonazePAM (KLONOPIN) 0.5 MG tablet Take 1 tablet (0.5 mg total) by mouth 2 (two) times daily as needed for anxiety. 01/18/15  Yes Lindell Spar I, NP  clonazePAM (KLONOPIN) 1 MG tablet Take 1 mg by mouth 3 (three) times daily. 11/21/20  Yes [provider]  lisinopril-hydrochlorothiazide (PRINZIDE,ZESTORETIC) 10-12.5 MG tablet Take 1 tablet by mouth daily. For high blood pressure 01/18/15  Yes Nwoko, Agnes I, NP  meloxicam (MOBIC) 15 MG tablet TAKE 1/2 TO 1 TABLET(7.5 TO 15 MG) BY MOUTH DAILY AS NEEDED FOR PAIN 02/03/17  Yes [provider]  pantoprazole (PROTONIX) 40 MG tablet Take 40 mg by mouth daily. 11/18/20  Yes [provider]  zolpidem (AMBIEN) 10 MG tablet Take by mouth. 01/18/15  Yes [provider]  clindamycin (CLEOCIN) 150 MG capsule Take 1 capsule (150 mg total) by mouth 2 (two) times daily. 06/06/21   Evelina Bucy, DPM  doxycycline (VIBRA-TABS) 100 MG tablet Take 1 tablet (100 mg total) by mouth 2 (two) times daily. 06/07/21   Felipa Furnace, DPM  lisinopril-hydrochlorothiazide (ZESTORETIC) 20-12.5 MG tablet  01/29/17   [provider]  omeprazole (PRILOSEC OTC) 20 MG tablet Take 1 tablet (20 mg total) by mouth daily. For acid reflux 01/18/15   Lindell Spar I, NP  oxyCODONE  10 MG TABS Take 1 tablet (10 mg total) by mouth every 6 (six) hours as needed for severe pain. 06/06/21   Evelina Bucy, DPM  zolpidem (AMBIEN) 10 MG tablet Take 1 tablet (10 mg total) by mouth at bedtime as needed for sleep. 01/18/15 02/17/15  Lindell Spar I, NP    ROS: All other systems have been reviewed and were otherwise negative with the exception of those mentioned in the HPI and as above.  Physical Exam: General: Alert, no acute distress Cardiovascular: No pedal edema Respiratory: No cyanosis, no use of accessory musculature GI: No organomegaly, abdomen is soft and non-tender Skin: No lesions in the area of chief complaint Neurologic: Sensation intact distally Psychiatric: Patient is competent for consent with normal mood and affect Lymphatic: No axillary or cervical lymphadenopathy  MUSCULOSKELETAL:  Left shoulder: limited ROM of the left shoulder. Weakness with cuff strength testing. Positive Obriends.   Imaging: MRI of the left shoulder demonstrates partial thickness cuff tear, significant fluid around the biceps.   Assessment: left shoulder cartilage disorder, impingement syndrome, bicep tendinitis, rotator cuff tear.  Plan: Plan for Procedure(s): SHOULDER ARTHROSCOPY WITH SUBACROMIAL DECOMPRESSION, ROTATOR CUFF REPAIR AND BICEP TENDON REPAIR  The risks benefits and alternatives were discussed with the patient including but not limited to the risks of nonoperative treatment, versus surgical intervention including infection, bleeding, nerve injury,  blood clots, cardiopulmonary complications, morbidity, mortality, among others, and they were willing to proceed.   The patient acknowledged the explanation, agreed to proceed with the plan and consent was signed.   Operative Plan: Left shoulder scope with SAD, DCE, BT, RCR Discharge Medications: Standard DVT Prophylaxis: None Physical Therapy: Outpatient PT Special Discharge needs: Sling. IceMan.    [name],  PA-C  02/14/2022 5:45 AM

## 2022-02-14 NOTE — Op Note (Signed)
Orthopaedic Surgery Operative Note (CSN: [id number])  Doni Widmer  Sep 15, 1965 Date of Surgery: 02/14/2022   DIAGNOSES: Left shoulder, acute on chronic rotator cuff tear, SLAP tear, biceps tendinitis, AC arthritis, and subacromial impingement.  POST-OPERATIVE DIAGNOSIS: same  PROCEDURE: Arthroscopic extensive debridement - 29823 Subdeltoid Bursa, Supraspinatus Tendon, Anterior Labrum, Superior Labrum, and humeral bone, glenoid bone, humeral cartilage, glenoid cartilage Arthroscopic distal clavicle excision - 74142 Arthroscopic subacromial decompression - 39532 Arthroscopic rotator cuff repair - 02334 Arthroscopic biceps tenodesis - 35686   OPERATIVE FINDING: Exam under anesthesia: Normal Articular space:  Significant superior labral fraying, redness in the rotator interval, thickening of the [facility] Ch[facility] surfaces: Normal Biceps:  Type II SLAP tear and significant redness in the biceps itself Subscapularis: Intact  Supraspinatus: Complete tear tear was retracted about 4 cm with thinning of the tissue and poor quality Infraspinatus: Complete tear as above   Patient's bone quality was reasonable however his tear was large and his tissue quality was poor.  We performed a 3 x 2 configuration repair within normal biceps tenodesis.  Patient had significant overhanging osteophytosis of the distal clavicle concerning for impingement and based on this as well as his clinical exam we felt that this conversation was appropriate.  I was worried that it would provide him symptoms later if we did not perform it now.  Post-operative plan: The patient will be non-weightbearing in a sling for 6 weeks without therapy motion for the shoulder, he can start therapy for educational purposes.  The patient will be discharged home.  DVT prophylaxis not indicated in ambulatory upper extremity patient without known risk factors.   Pain control with PRN pain medication preferring oral medicines.  Follow up  plan will be scheduled in approximately 7 days for incision check and XR.  Surgeons:Primary: Hiram Gash, MD Assistants:Caroline McBane PA-C Location: [place] OR ROOM 1 Anesthesia: General with Exparel interscalene block Antibiotics: Ancef 2 g Tourniquet time: None Estimated Blood Loss: Minimal Complications: None Specimens: None Implants: Implant Name Type Inv. Item Serial No. Manufacturer Lot No. LRB No. Used Action  IMPLANT FIBERTAK KNTLS 2.6 - [id number] Anchor IMPLANT FIBERTAK KNTLS 2.6  [employer] [id number] Left 1 Implanted  ANCHOR SUT 1.8 FIBERTAK SB KL - [id number] Anchor ANCHOR SUT 1.8 FIBERTAK SB KL  [employer] [id number] Left 1 Implanted  ANCHOR SUT 1.8 FIBERTAK SB KL - [id number] Anchor ANCHOR SUT 1.8 FIBERTAK SB KL  [employer] [id number] Left 1 Implanted  ANCHOR SUT BIO SW 4.75X19.1 - [id number] Anchor ANCHOR SUT BIO SW 4.75X19.1  [employer] [id number] Left 1 Implanted  ANCHOR SUT BIO SW 4.75X19.1 - [id number] Anchor ANCHOR SUT BIO SW 4.75X19.1  [employer] [id number] Left 1 Implanted  ANCHOR FIBERTAK RC 2.6 (BLUE) - [id number] Anchor ANCHOR FIBERTAK RC 2.6 (BLUE)  [employer] [id number] Left 1 Implanted  ANCHOR SUT FBRTK 2.6X1.802-512-2658 - [id number] Anchor ANCHOR SUT FBRTK [id number]  Rolinda Roan [id number] Left 1 Implanted    Indications for Surgery:   Keyton Bhat is a 56 y.o. male with continued shoulder pain refractory to nonoperative measures for extended period of time.   The risks and benefits were explained at length including but not limited to continued pain, cuff failure, biceps tenodesis failure, stiffness, need for further surgery and infection.   Procedure:   Patient was correctly identified in the preoperative holding area and operative site marked.  Patient brought to OR and positioned beachchair on an [place] table ensuring that all bony prominences were padded and the head  was in an appropriate location.  Anesthesia was induced and the operative shoulder was prepped and  draped in the usual sterile fashion.  Timeout was called preincision.  A standard posterior viewing portal was made after localizing the portal with a spinal needle.  An anterior accessory portal was also made.  After clearing the articular space the camera was positioned in the subacromial space.  Findings above.    Extensive debridement was performed of the anterior interval tissue, labral fraying and the bursa.  Glenoid and humeral bone as well as cartilage were also debrided  Subacromial decompression: We made a lateral portal with spinal needle guidance. We then proceeded to debride bursal tissue extensively with a shaver and arthrocare device. At that point we continued to identify the borders of the acromion and identify the spur. We then carefully preserved the deltoid fascia and used a burr to convert the acromion to a Type 1 flat acromion without issue.   Biceps tenodesis: We marked the tendon and then performed a tenotomy and debridement of the stump in the articular space. We then identified the biceps tendon in its groove suprapec with the arthroscope in the lateral portal taking care to move from lateral to medial to avoid injury to the subscapularis. At that point we unroofed the tendon itself and mobilized it. An accessory anterior portal was made in line with the tendon and we grasped it from the anterior superior portal and worked from the accessory anterior portal. Two Fibertak 1.85m knotless anchors were placed in the groove and the tendon was secured in a luggage loop style fashion with a pass of the limb of suture through the tendon using a scorpion device to avoid pull-through.  Repair was completed with good tension on the tendon.  Residual stump of the tendon was removed after being resected with a RF ablator.  Distal Clavicle resection:  The scope was placed in the subacromial space from the posterior portal.  A hemostat was placed through the anterior portal and we spread at the  [employer] [employer].  A burr was then inserted and 10 mm of distal clavicle was resected taking care to avoid damage to the capsule around the joint and avoiding overhanging bone posteriorly.    Rotator cuff repair: After completion of the above we prepared the tuberosity for healing.  We mobilized the cuff and noted that it had poor quality.  We debrided some supraspinatus thin bursal remnant that was most lateral.  At that point we mobilized the cuff.  We placed three 2.6 [employer] anchors loaded with fiber tape and a knotless mechanism, these were fiber tack anchors.  Once they are performed we shuttled the sutures using a scorpion device and used the knotless mechanism sutures to perform a medial tiedown lengthening each anchor together.  This allowed for medial compression of the footprint.  We then brought the tapes over 2-4.75 bio composite swivel locks 8 to 10 mm below the tip of the tuberosity.  We have good purchase and good approximation of the tendon to bone.  The incisions were closed with absorbable monocryl and steri strips.  A sterile dressing was placed along with a sling. The patient was awoken from general anesthesia and taken to the PACU in stable condition without complication.   CNoemi Chapel PA-C, present and scrubbed throughout the case, critical for completion in a timely fashion, and for retraction, instrumentation, closure.

## 2022-02-14 NOTE — Interval H&P Note (Signed)
All questions answered, patient wants to proceed with procedure. ? ?

## 2022-02-14 NOTE — Anesthesia Procedure Notes (Signed)
Procedure Name: Intubation Date/Time: 02/14/2022 10:40 AM  Performed by: Glory Buff, CRNAPre-anesthesia Checklist: Patient identified, Emergency Drugs available, Suction available and Patient being monitored Patient Re-evaluated:Patient Re-evaluated prior to induction Oxygen Delivery Method: Circle system utilized Preoxygenation: Pre-oxygenation with 100% oxygen Induction Type: IV induction Ventilation: Mask ventilation without difficulty Laryngoscope Size: Miller and 3 Grade View: Grade I Tube type: Oral Tube size: 7.5 mm Number of attempts: 1 Airway Equipment and Method: Stylet and Oral airway Placement Confirmation: ETT inserted through vocal cords under direct vision, positive ETCO2 and breath sounds checked- equal and bilateral Secured at: 21 cm Tube secured with: Tape Dental Injury: Teeth and Oropharynx as per pre-operative assessment

## 2022-02-14 NOTE — Anesthesia Preprocedure Evaluation (Addendum)
Anesthesia Evaluation  Patient identified by MRN, date of birth, ID band Patient awake    Reviewed: Allergy & Precautions, NPO status , Patient's Chart, lab work & pertinent test results  Airway Mallampati: III  TM Distance: <3 FB Neck ROM: Full    Dental no notable dental hx.    Pulmonary neg pulmonary ROS,    Pulmonary exam normal breath sounds clear to auscultation       Cardiovascular hypertension, Pt. on medications Normal cardiovascular exam Rhythm:Regular Rate:Normal     Neuro/Psych Depression negative neurological ROS     GI/Hepatic Neg liver ROS, GERD  Medicated,  Endo/Other  negative endocrine ROS  Renal/GU negative Renal ROS  negative genitourinary   Musculoskeletal negative musculoskeletal ROS (+)   Abdominal   Peds negative pediatric ROS (+)  Hematology negative hematology ROS (+)   Anesthesia Other Findings   Reproductive/Obstetrics negative OB ROS                            Anesthesia Physical Anesthesia Plan  ASA: 2  Anesthesia Plan: General   Post-op Pain Management: Regional block*   Induction: Intravenous  PONV Risk Score and Plan: 2 and Ondansetron, Dexamethasone and Treatment may vary due to age or medical condition  Airway Management Planned: Oral ETT  Additional Equipment:   Intra-op Plan:   Post-operative Plan: Extubation in OR  Informed Consent: I have reviewed the patients History and Physical, chart, labs and discussed the procedure including the risks, benefits and alternatives for the proposed anesthesia with the patient or authorized representative who has indicated his/her understanding and acceptance.     Dental advisory given  Plan Discussed with: CRNA and Surgeon  Anesthesia Plan Comments:         Anesthesia Quick Evaluation

## 2022-02-15 ENCOUNTER — Encounter (HOSPITAL_BASED_OUTPATIENT_CLINIC_OR_DEPARTMENT_OTHER): Payer: Self-pay | Admitting: Orthopaedic Surgery

## 2022-02-15 NOTE — Progress Notes (Signed)
Left message stating courtesy call and if any questions or concerns please call the doctors office.  

## 2022-07-06 ENCOUNTER — Emergency Department (HOSPITAL_BASED_OUTPATIENT_CLINIC_OR_DEPARTMENT_OTHER)
Admission: EM | Admit: 2022-07-06 | Discharge: 2022-07-06 | Disposition: A | Discharge status: Home / Self Care | Payer: BC Managed Care – PPO | Attending: Emergency Medicine | Admitting: Emergency Medicine

## 2022-07-06 ENCOUNTER — Emergency Department (HOSPITAL_BASED_OUTPATIENT_CLINIC_OR_DEPARTMENT_OTHER): Payer: BC Managed Care – PPO

## 2022-07-06 ENCOUNTER — Encounter (HOSPITAL_BASED_OUTPATIENT_CLINIC_OR_DEPARTMENT_OTHER): Payer: Self-pay | Admitting: Urology

## 2022-07-06 ENCOUNTER — Other Ambulatory Visit: Payer: Self-pay

## 2022-07-06 DIAGNOSIS — K219 Gastro-esophageal reflux disease without esophagitis: Secondary | ICD-10-CM | POA: Diagnosis not present

## 2022-07-06 DIAGNOSIS — R1310 Dysphagia, unspecified: Secondary | ICD-10-CM | POA: Diagnosis present

## 2022-07-06 DIAGNOSIS — I10 Essential (primary) hypertension: Secondary | ICD-10-CM | POA: Diagnosis not present

## 2022-07-06 DIAGNOSIS — E876 Hypokalemia: Secondary | ICD-10-CM | POA: Diagnosis not present

## 2022-07-06 LAB — URINALYSIS, MICROSCOPIC (REFLEX)
Squamous Epithelial / HPF: NONE SEEN /HPF (ref 0–5)
WBC, UA: NONE SEEN WBC/hpf (ref 0–5)

## 2022-07-06 LAB — CBC
HCT: 47.1 % (ref 39.0–52.0)
Hemoglobin: 15.6 g/dL (ref 13.0–17.0)
MCH: 29.1 pg (ref 26.0–34.0)
MCHC: 33.1 g/dL (ref 30.0–36.0)
MCV: 87.9 fL (ref 80.0–100.0)
Platelets: 269 10*3/uL (ref 150–400)
RBC: 5.36 MIL/uL (ref 4.22–5.81)
RDW: 12.9 % (ref 11.5–15.5)
WBC: 8.9 10*3/uL (ref 4.0–10.5)
nRBC: 0 % (ref 0.0–0.2)

## 2022-07-06 LAB — URINALYSIS, ROUTINE W REFLEX MICROSCOPIC
Bilirubin Urine: NEGATIVE
Glucose, UA: NEGATIVE mg/dL
Ketones, ur: NEGATIVE mg/dL
Leukocytes,Ua: NEGATIVE
Nitrite: NEGATIVE
Protein, ur: NEGATIVE mg/dL
Specific Gravity, Urine: 1.015 (ref 1.005–1.030)
pH: 7.5 (ref 5.0–8.0)

## 2022-07-06 LAB — LIPASE, BLOOD: Lipase: 34 U/L (ref 11–51)

## 2022-07-06 LAB — COMPREHENSIVE METABOLIC PANEL
ALT: 41 U/L (ref 0–44)
AST: 30 U/L (ref 15–41)
Albumin: 3.8 g/dL (ref 3.5–5.0)
Alkaline Phosphatase: 57 U/L (ref 38–126)
Anion gap: 8 (ref 5–15)
BUN: 23 mg/dL — ABNORMAL HIGH (ref 6–20)
CO2: 28 mmol/L (ref 22–32)
Calcium: 8.7 mg/dL — ABNORMAL LOW (ref 8.9–10.3)
Chloride: 101 mmol/L (ref 98–111)
Creatinine, Ser: 1.01 mg/dL (ref 0.61–1.24)
GFR, Estimated: >60 mL/min (ref >60)
Glucose, Bld: 97 mg/dL (ref 70–99)
Potassium: 3.2 mmol/L — ABNORMAL LOW (ref 3.5–5.1)
Sodium: 137 mmol/L (ref 135–145)
Total Bilirubin: 0.6 mg/dL (ref 0.3–1.2)
Total Protein: 7.4 g/dL (ref 6.5–8.1)

## 2022-07-06 LAB — TROPONIN I (HIGH SENSITIVITY)
Troponin I (High Sensitivity): 2 ng/L (ref <18)
Troponin I (High Sensitivity): 3 ng/L (ref <18)

## 2022-07-06 LAB — CBG MONITORING, ED: Glucose-Capillary: 98 mg/dL (ref 70–99)

## 2022-07-06 MED ORDER — ASPIRIN 81 MG PO CHEW
324.0000 mg | CHEWABLE_TABLET | Freq: Once | ORAL | Status: AC
  Administered 2022-07-06: 324 mg via ORAL
  Filled 2022-07-06: qty 4

## 2022-07-06 MED ORDER — ALUM & MAG HYDROXIDE-SIMETH 200-200-20 MG/5ML PO SUSP
30.0000 mL | Freq: Once | ORAL | Status: AC
  Administered 2022-07-06: 30 mL via ORAL
  Filled 2022-07-06: qty 30

## 2022-07-06 MED ORDER — PANTOPRAZOLE SODIUM 40 MG IV SOLR
40.0000 mg | Freq: Once | INTRAVENOUS | Status: AC
  Administered 2022-07-06: 40 mg via INTRAVENOUS
  Filled 2022-07-06: qty 10

## 2022-07-06 MED ORDER — IOHEXOL 350 MG/ML SOLN
75.0000 mL | Freq: Once | INTRAVENOUS | Status: AC | PRN
  Administered 2022-07-06: 75 mL via INTRAVENOUS

## 2022-07-06 MED ORDER — SUCRALFATE 1 G PO TABS: 1.0000 g | ORAL_TABLET | Freq: Three times a day (TID) | ORAL | 0 refills | Status: DC

## 2022-07-06 NOTE — Discharge Instructions (Signed)
Continue your Protonix.  You can increase it to 40 mg twice daily for the next 7 days start taking the Carafate to see if that helps with the acid reflux.  Follow-up with your GI doctor as we discussed for further evaluation.

## 2022-07-06 NOTE — ED Provider Notes (Signed)
[facility] Provider Note   CSN: [id number] Arrival date & time: 07/06/22  1043     History {Add pertinent medical, surgical, social history, OB history to HPI:1} Chief Complaint  Patient presents with   Dysphagia    Troy Stuart is a 57 y.o. male.  HPI    Patient has a history of hypertension and acid reflux who has been on a weight loss medication ZEPBOUND.  Patient states he had his dose increased earlier this week.  Initially was fine but a few days later he started having trouble with burning discomfort in his chest.  He is having increased burning being and belching.  He has discomfort with swallowing.  Patient also felt a squeezing sensation.  He spoke with his doctor today and they suggested he come to the ED for evaluation.  He denies any fevers or chills.  No leg swelling.  He is not having any abdominal pain now although last night he did have sharp pain in his lower abdomen that resolved.  He has been having some issues with constipation that he feels is related to his medications.  He has been using prune juice  Home Medications Prior to Admission medications   Medication Sig Start Date End Date Taking? Authorizing Provider  buPROPion (WELLBUTRIN XL) 300 MG 24 hr tablet Take 1 tablet by mouth daily. 12/10/18   [provider]  clindamycin (CLEOCIN) 150 MG capsule Take 1 capsule (150 mg total) by mouth 2 (two) times daily. 06/06/21   Evelina Bucy, DPM  clonazePAM (KLONOPIN) 0.5 MG tablet Take 1 tablet (0.5 mg total) by mouth 2 (two) times daily as needed for anxiety. 01/18/15   Lindell Spar I, NP  clonazePAM (KLONOPIN) 1 MG tablet Take 1 mg by mouth 3 (three) times daily. 11/21/20   [provider]  diclofenac (VOLTAREN) 75 MG EC tablet Take 1 tablet (75 mg total) by mouth 2 (two) times daily. 02/14/22   McBane, Maylene Roes, PA-C  doxycycline (VIBRA-TABS) 100 MG tablet Take 1 tablet (100 mg total) by mouth 2  (two) times daily. 06/07/21   Felipa Furnace, DPM  lisinopril-hydrochlorothiazide (PRINZIDE,ZESTORETIC) 10-12.5 MG tablet Take 1 tablet by mouth daily. For high blood pressure 01/18/15   Lindell Spar I, NP  lisinopril-hydrochlorothiazide (ZESTORETIC) 20-12.5 MG tablet  01/29/17   [provider]  methocarbamol (ROBAXIN) 500 MG tablet Take 1 tablet (500 mg total) by mouth every 8 (eight) hours as needed for muscle spasms. 02/14/22   McBane, Maylene Roes, PA-C  omeprazole (PRILOSEC OTC) 20 MG tablet Take 1 tablet (20 mg total) by mouth daily. For acid reflux 01/18/15   Lindell Spar I, NP  pantoprazole (PROTONIX) 40 MG tablet Take 40 mg by mouth daily. 11/18/20   [provider]      Allergies    Penicillins    Review of Systems   Review of Systems  Physical Exam Updated Vital Signs BP 132/86 (BP Location: Left Arm)   Pulse 84   Temp 98.2 F (36.8 C) (Oral)   Resp 18   Ht 1.727 m (5\' 8" )   Wt 98.4 kg   SpO2 97%   BMI 32.98 kg/m  Physical Exam Vitals and nursing note reviewed.  Constitutional:      General: He is not in acute distress.    Appearance: He is well-developed.  HENT:     Head: Normocephalic and atraumatic.     Right Ear: External ear normal.  Left Ear: External ear normal.  Eyes:     General: No scleral icterus.       Right eye: No discharge.        Left eye: No discharge.     Conjunctiva/sclera: Conjunctivae normal.  Neck:     Trachea: No tracheal deviation.  Cardiovascular:     Rate and Rhythm: Normal rate and regular rhythm.  Pulmonary:     Effort: Pulmonary effort is normal. No respiratory distress.     Breath sounds: Normal breath sounds. No stridor. No wheezing or rales.  Abdominal:     General: Bowel sounds are normal. There is no distension.     Palpations: Abdomen is soft.     Tenderness: There is no abdominal tenderness. There is no guarding or rebound.  Musculoskeletal:        General: No tenderness or deformity.     Cervical  back: Neck supple.  Skin:    General: Skin is warm and dry.     Findings: No rash.  Neurological:     General: No focal deficit present.     Mental Status: He is alert.     Cranial Nerves: No cranial nerve deficit, dysarthria or facial asymmetry.     Sensory: No sensory deficit.     Motor: No abnormal muscle tone or seizure activity.     Coordination: Coordination normal.  Psychiatric:        Mood and Affect: Mood normal.     ED Results / Procedures / Treatments   Labs (all labs ordered are listed, but only abnormal results are displayed) Labs Reviewed  CBC  COMPREHENSIVE METABOLIC PANEL  LIPASE, BLOOD  URINALYSIS, ROUTINE W REFLEX MICROSCOPIC  CBG MONITORING, ED  TROPONIN I (HIGH SENSITIVITY)    EKG None  Radiology No results found.  Procedures Procedures  {Document cardiac monitor, telemetry assessment procedure when appropriate:1}  Medications Ordered in ED Medications  aspirin chewable tablet 324 mg (has no administration in time range)  alum & mag hydroxide-simeth (MAALOX/MYLANTA) 200-200-20 MG/5ML suspension 30 mL (has no administration in time range)  pantoprazole (PROTONIX) injection 40 mg (has no administration in time range)    ED Course/ Medical Decision Making/ A&P   {   Click here for ABCD2, HEART and other calculatorsREFRESH Note before signing :1}                          Medical Decision Making Amount and/or Complexity of Data Reviewed Labs: ordered. Radiology: ordered.  Risk OTC drugs. Prescription drug management.   ***  {Document critical care time when appropriate:1} {Document review of labs and clinical decision tools ie heart score, Chads2Vasc2 etc:1}  {Document your independent review of radiology images, and any outside records:1} {Document your discussion with family members, caretakers, and with consultants:1} {Document social determinants of health affecting pt's care:1} {Document your decision making why or why not  admission, treatments were needed:1} Final Clinical Impression(s) / ED Diagnoses Final diagnoses:  None    Rx / DC Orders ED Discharge Orders     None

## 2022-07-06 NOTE — ED Triage Notes (Addendum)
Pt states went to bed with heartburn and states after waking up this am  States feels like constriction in his esophagus and when he drank water it was uncomfortable  Swallowing saliva and fluids without obstruction, NAD noted   Started weight loss medication a few weeks ago and increased dose last week

## 2022-07-06 NOTE — Progress Notes (Signed)
RT in to see patient for occasional desaturations as low as 87%. Upon arrival pt in no distress, lying in bed. Pt able to speak in complete sentences. Pt placed on 3L nasal cannula at this time with improvement in SpO2. RT will continue to monitor and be available as needed.

## 2022-07-06 NOTE — ED Notes (Signed)
Pt aware of need for urine sample.  

## 2023-10-21 NOTE — Progress Notes (Signed)
 Subjective:    Troy Stuart is a 58 y.o. (DOB 10-26-1965) male.     Patient presents with  . Dizziness    Pt states his BP has been running low. Last night he stood up and felt dizzy, and passed out. His wife called EMS.      HPI  Pt presents for dizziness and low blood pressure. Last night pt stood up, felt dizzy and passed out. Wife called EMS and blood pressure was low. He did not take hydrochlorothiazide  last night and took losartan 25mg  this morning. His home readings have been around 90's/60's. Pt started zepbound and lost 26lb since March. Pt denies headaches, vision changes, lower extremity edema, chest pain, shortness of breath, abdominal pain or neurologic changes.   Wt Readings from Last 3 Encounters:  10/21/23 166 lb 6.4 oz (75.5 kg)  08/21/23 187 lb (84.8 kg)  07/02/23 192 lb 3.2 oz (87.2 kg)    BP Readings from Last 3 Encounters:  10/21/23 104/80  08/21/23 118/80  07/02/23 117/81      Reviewed and updated this visit by provider: Tobacco  Allergies  Meds  Problems  Med Hx  Surg Hx  Fam Hx       Review of Systems ROS is reviewed and complete, all others are negative except as noted in HPI.     Objective:   Vitals:   10/21/23 1021  BP: 104/80  Pulse: 70  Temp: 98 F (36.7 C)  Resp: 18  Height: 5' 8 (1.727 m)  Weight: 166 lb 6.4 oz (75.5 kg)  SpO2: 94%  BMI (Calculated): 25.3   Physical Exam Vitals reviewed.  Constitutional:      Appearance: He is well-developed.  Cardiovascular:     Rate and Rhythm: Normal rate and regular rhythm.     Heart sounds: Normal heart sounds, S1 normal and S2 normal.  Pulmonary:     Effort: Pulmonary effort is normal. No accessory muscle usage or respiratory distress.     Breath sounds: Normal breath sounds. No decreased breath sounds, wheezing, rhonchi or rales.  Neurological:     Mental Status: He is alert and oriented to person, place, and time.     Cranial Nerves: Cranial nerves are intact.      Motor: Motor function is intact.     Coordination: Coordination is intact.     Gait: Gait is intact.  Psychiatric:        Attention and Perception: Attention and perception normal.        Mood and Affect: Mood and affect normal.        Speech: Speech normal.        Behavior: Behavior normal. Behavior is cooperative.        Thought Content: Thought content normal.        Judgment: Judgment normal.         Assessment / Plan:   Assessment 1. Dizziness (Primary)    Plan Troy Stuart is a 58 y/o male with hypertension. His blood pressure readings have gradually decreased with his weight loss. Pt started zepbound and lost 26lb since March. His home readings are around 90's/60's. Last night, he was dizzy and had an episode of syncope. His wife called EMS and his blood pressure was low. He refused ED. Pt did not take HCTZ and blood pressure today is 104/80. Exam is unremarkable. Recommend he stop taking HCTZ and continue losartan. He will continue to monitor his blood pressure and follow up with Lauren at his upcoming  visit on 10/30/2023.   Risks, benefits, and alternatives of the medications and treatment plan prescribed today were discussed, and patient expressed understanding. Plan follow-up as discussed or as needed if any worsening symptoms or change in condition.

## 2023-12-26 ENCOUNTER — Encounter (HOSPITAL_BASED_OUTPATIENT_CLINIC_OR_DEPARTMENT_OTHER): Payer: Self-pay | Admitting: Orthopaedic Surgery

## 2023-12-26 ENCOUNTER — Other Ambulatory Visit: Payer: Self-pay

## 2023-12-29 ENCOUNTER — Other Ambulatory Visit: Payer: Self-pay

## 2023-12-29 ENCOUNTER — Encounter (HOSPITAL_BASED_OUTPATIENT_CLINIC_OR_DEPARTMENT_OTHER)
Admission: RE | Admit: 2023-12-29 | Discharge: 2023-12-29 | Disposition: A | Discharge status: Home / Self Care | Source: Ambulatory Visit | Attending: Orthopaedic Surgery

## 2023-12-29 DIAGNOSIS — Z01818 Encounter for other preprocedural examination: Secondary | ICD-10-CM | POA: Diagnosis present

## 2023-12-29 LAB — BASIC METABOLIC PANEL WITH GFR
Anion gap: 11 (ref 5–15)
BUN: 19 mg/dL (ref 6–20)
CO2: 24 mmol/L (ref 22–32)
Calcium: 8.9 mg/dL (ref 8.9–10.3)
Chloride: 99 mmol/L (ref 98–111)
Creatinine, Ser: 1.04 mg/dL (ref 0.61–1.24)
GFR, Estimated: >60 mL/min (ref >60)
Glucose, Bld: 119 mg/dL — ABNORMAL HIGH (ref 70–99)
Potassium: 3.3 mmol/L — ABNORMAL LOW (ref 3.5–5.1)
Sodium: 134 mmol/L — ABNORMAL LOW (ref 135–145)

## 2023-12-29 NOTE — Progress Notes (Signed)
 Provided patient with pre-surgical soap and instructions. Patient verbalized understanding.

## 2023-12-30 NOTE — H&P (Signed)
 PREOPERATIVE H&P  Chief Complaint: Articular cartilage disorder of shoulder region, right, osteoarthritis of right shoulder,Impingement syndrome of shoulder, right, Bicipital tendinitis of right shoulder, rotator cuff tear, right,  HPI: Troy Stuart is a 58 y.o. male who is scheduled for, Procedure(s): ARTHROSCOPY, SHOULDER, WITH ROTATOR CUFF REPAIR.   The patient is a 58 year old who is well known to us . He had a massive cuff repair on the contralateral side a few years ago. He has had increasing pain in the setting of partial thickness cuff tear  on the right shoulder. He is now in a sling. He is not able to range his arm. He tried to do physician directed exercises.   Symptoms are rated as moderate to severe, and have been worsening.  This is significantly impairing activities of daily living.    Please see clinic note for further details on this patient's care.    He has elected for surgical management.   Past Medical History:  Diagnosis Date   Acid reflux    Anxiety    Depression    Hyperlipidemia    Hypertension    Sleep apnea    no cpap   Suicidal ideations    Past Surgical History:  Procedure Laterality Date   FOOT SURGERY Right 2023   HERNIA REPAIR     2002   SHOULDER ARTHROSCOPY     SHOULDER ARTHROSCOPY WITH SUBACROMIAL DECOMPRESSION, ROTATOR CUFF REPAIR AND BICEP TENDON REPAIR Left 02/14/2022   Procedure: SHOULDER ARTHROSCOPY WITH SUBACROMIAL DECOMPRESSION, ROTATOR CUFF REPAIR AND BICEP TENDON REPAIR;  Surgeon: Cristy Bonner DASEN, MD;  Location: [facility];  Service: Orthopedics;  Laterality: Left;   Social History   Socioeconomic History   Marital status: Married    Spouse name: Not on file   Number of children: Not on file   Years of education: Not on file   Highest education level: Not on file  Occupational History   Not on file  Tobacco Use   Smoking status: Never   Smokeless tobacco: Never  Substance and Sexual Activity    Alcohol use: No   Drug use: No   Sexual activity: Yes  Other Topics Concern   Not on file  Social History Narrative   Not on file   Social Drivers of Health   Financial Resource Strain: Low Risk  (08/29/2023)   Received from [facility]   Overall Financial Resource Strain (CARDIA)    Difficulty of Paying Living Expenses: Not hard at all  Food Insecurity: No Food Insecurity (08/29/2023)   Received from [facility]   Hunger Vital Sign    Within the past 12 months, you worried that your food would run out before you got the money to buy more.: Never true    Within the past 12 months, the food you bought just didn't last and you didn't have money to get more.: Never true  Transportation Needs: No Transportation Needs (08/29/2023)   Received from [facility] - Transportation    Lack of Transportation (Medical): No    Lack of Transportation (Non-Medical): No  Physical Activity: Insufficiently Active (08/29/2023)   Received from [facility]   Exercise Vital Sign    On average, how many days per week do you engage in moderate to strenuous exercise (like a brisk walk)?: 4 days    On average, how many minutes do you engage in exercise at this level?: 30 min  Stress: Stress Concern Present (08/29/2023)  Received from [facility] - Occupational Stress Questionnaire    Feeling of Stress : Rather much  Social Connections: Socially Integrated (08/29/2023)   Received from [facility]   Social Network    How would you rate your social network (family, work, friends)?: Good participation with social networks   Family History  Problem Relation Age of Onset   Diabetes Mother    Mental illness Father    Depression Brother    Allergies  Allergen Reactions   Penicillins Hives    .SABRAHas patient had a PCN reaction causing immediate rash, facial/tongue/throat swelling, SOB or lightheadedness with hypotension: No Has patient had a PCN reaction  causing severe rash involving mucus membranes or skin necrosis: No Has patient had a PCN reaction that required hospitalization No Has patient had a PCN reaction occurring within the last 10 years: No If all of the above answers are NO, then may proceed with Cephalosporin use.    Prior to Admission medications   Medication Sig Start Date End Date Taking? Authorizing Provider  ALPRAZolam (XANAX) 1 MG tablet Take 1 mg by mouth 3 (three) times daily.   Yes [provider]  atorvastatin (LIPITOR) 20 MG tablet Take 20 mg by mouth daily.   Yes [provider]  buPROPion (WELLBUTRIN XL) 300 MG 24 hr tablet Take 1 tablet by mouth daily. 12/10/18  Yes [provider]  celecoxib  (CELEBREX ) 200 MG capsule Take 200 mg by mouth 2 (two) times daily.   Yes [provider]  dicyclomine (BENTYL) 20 MG tablet Take 10 mg by mouth every 6 (six) hours.   Yes [provider]  hydrochlorothiazide  (HYDRODIURIL ) 25 MG tablet Take 25 mg by mouth daily.   Yes [provider]  lamoTRIgine (LAMICTAL) 100 MG tablet Take 100 mg by mouth daily.   Yes [provider]  losartan (COZAAR) 25 MG tablet Take 25 mg by mouth daily.   Yes [provider]  Melatonin 12 MG TBDP Take by mouth.   Yes [provider]  pantoprazole  (PROTONIX ) 40 MG tablet Take 40 mg by mouth daily. 11/18/20  Yes [provider]  senna (SENOKOT) 8.6 MG TABS tablet Take 1 tablet by mouth.   Yes [provider]  Suvorexant (BELSOMRA) 20 MG TABS Take by mouth.   Yes [provider]  tirzepatide (ZEPBOUND) 15 MG/0.5ML Pen Inject into the skin once a week.   Yes [provider]  vitamin B-12 (CYANOCOBALAMIN) 500 MCG tablet Take 500 mcg by mouth daily.   Yes [provider]    ROS: All other systems have been reviewed and were otherwise negative with the exception of those mentioned in the HPI and as above.  Physical Exam: General:  Alert, no acute distress Cardiovascular: No pedal edema Respiratory: No cyanosis, no use of accessory musculature GI: No organomegaly, abdomen is soft and non-tender Skin: No lesions in the area of chief complaint Neurologic: Sensation intact distally Psychiatric: Patient is competent for consent with normal mood and affect Lymphatic: No axillary or cervical lymphadenopathy  MUSCULOSKELETAL:  The range of motion of the shoulder is to about 90 degrees, passive 170. Cuff is grossly weak with supraspinatus and infraspinatus testing.   Imaging: MRI demonstrates a leading edge full thickness supraspinatus tear. Fluid around the biceps.  Distal clavicle arthrosis with edema and distal clavicle and lateral hanging acromial spur  Assessment: Articular cartilage disorder of shoulder region, right, osteoarthritis of right shoulder,Impingement syndrome  of shoulder, right, Bicipital tendinitis of right shoulder, rotator cuff tear, right,  Plan: Plan for Procedure(s): ARTHROSCOPY, SHOULDER, WITH ROTATOR CUFF REPAIR  The risks benefits and alternatives were discussed with the patient including but not limited to the risks of nonoperative treatment, versus surgical intervention including infection, bleeding, nerve injury,  blood clots, cardiopulmonary complications, morbidity, mortality, among others, and they were willing to proceed.   The patient acknowledged the explanation, agreed to proceed with the plan and consent was signed.   Operative Plan: Right shoulder scope with  rotator cuff repair, biceps tenodesis, distal clavicle excision and subacromial decompression  Discharge Medications: standard DVT Prophylaxis: none Physical Therapy: outpatient PT Special Discharge needs: Sling   Aleck LOISE Stalling, PA-C  12/30/2023 8:20 AM

## 2023-12-31 NOTE — Anesthesia Preprocedure Evaluation (Signed)
 Anesthesia Evaluation  Patient identified by MRN, date of birth, ID band Patient awake    Reviewed: Allergy & Precautions, NPO status , Patient's Chart, lab work & pertinent test results  History of Anesthesia Complications Negative for: history of anesthetic complications  Airway Mallampati: II  TM Distance: >3 FB Neck ROM: Full    Dental no notable dental hx. (+) Dental Advisory Given   Pulmonary sleep apnea    Pulmonary exam normal breath sounds clear to auscultation       Cardiovascular hypertension, (-) angina (-) Past MI Normal cardiovascular exam Rhythm:Regular Rate:Normal     Neuro/Psych  PSYCHIATRIC DISORDERS Anxiety Depression       GI/Hepatic Neg liver ROS,GERD  Medicated and Controlled,,  Endo/Other  negative endocrine ROS    Renal/GU Lab Results      Component                Value               Date                      NA                       134 (L)             12/29/2023                CL                       99                  12/29/2023                K                        3.3 (L)             12/29/2023                CO2                      24                  12/29/2023                BUN                      19                  12/29/2023                CREATININE               1.04                12/29/2023                GFRNONAA                 >60                 12/29/2023                CALCIUM                  8.9  12/29/2023                ALBUMIN                  3.8                 07/06/2022                GLUCOSE                  119 (H)             12/29/2023                Musculoskeletal negative musculoskeletal ROS (+)    Abdominal   Peds  Hematology negative hematology ROS (+)   Anesthesia Other Findings All:PCN  Reproductive/Obstetrics                              Anesthesia Physical Anesthesia Plan  ASA: 3  Anesthesia  Plan: General and Regional   Post-op Pain Management: Regional block*, Minimal or no pain anticipated, Ofirmev  IV (intra-op)* and Precedex   Induction: Intravenous  PONV Risk Score and Plan: 4 or greater and Treatment may vary due to age or medical condition, Midazolam , Ondansetron  and Dexamethasone   Airway Management Planned: Oral ETT  Additional Equipment: None  Intra-op Plan:   Post-operative Plan: Extubation in OR  Informed Consent:      Dental advisory given  Plan Discussed with:   Anesthesia Plan Comments: (GA w R ISB)         Anesthesia Quick Evaluation

## 2023-12-31 NOTE — Discharge Instructions (Addendum)
 Bonner Hair MD, MPH Aleck Stalling, PA-C [facility] [address], [address] [phone] (tel)   [phone] (fax)   POST-OPERATIVE INSTRUCTIONS - SHOULDER ARTHROSCOPY  WOUND CARE You may remove the Operative Dressing on Post-Op Day #3 (72hrs after surgery).   Alternatively if you would like you can leave dressing on until follow-up if within 7-8 days but keep it dry. Leave steri-strips in place until they fall off on their own, usually 2 weeks postop. There may be a small amount of fluid/bleeding leaking at the surgical site.  This is normal; the shoulder is filled with fluid during the procedure and can leak for 24-48hrs after surgery.  You may change/reinforce the bandage as needed.  Use the Cryocuff or Ice as often as possible for the first 7 days, then as needed for pain relief. Always keep a towel, ACE wrap or other barrier between the cooling unit and your skin.  You may shower on Post-Op Day #3. Gently pat the area dry.  Do not soak the shoulder in water or submerge it.  Keep incisions as dry as possible. Do not go swimming in the pool or ocean until 4 weeks after surgery or when otherwise instructed.    EXERCISES Wear the sling at all times  You may remove the sling for showering, but keep the arm across the chest or in a secondary sling.     It is normal for your fingers/hand to become more swollen after surgery and discolored from bruising.   This will resolve over the first few weeks usually after surgery. Please continue to ambulate and do not stay sitting or lying for too long.  Perform foot and wrist pumps to assist in circulation.  PHYSICAL THERAPY - You will begin physical therapy soon after surgery (unless otherwise specified) - Please call to set up an appointment, if you do not already have one  - Let our office if there are any issues with scheduling your therapy  - You have a physical therapy appointment scheduled at [facility] (across  the hall from our office) on 9/15   REGIONAL ANESTHESIA (NERVE BLOCKS) The anesthesia team may have performed a nerve block for you this is a great tool used to minimize pain.   The block may start wearing off overnight (between 8-24 hours postop) When the block wears off, your pain may go from nearly zero to the pain you would have had postop without the block. This is an abrupt transition but nothing dangerous is happening.   This can be a challenging period but utilize your as needed pain medications to try and manage this period. We suggest you use the pain medication the first night prior to going to bed, to ease this transition.  You may take an extra dose of narcotic when this happens if needed  POST-OP MEDICATIONS- Multimodal approach to pain control In general your pain will be controlled with a combination of substances.  Prescriptions unless otherwise discussed are electronically sent to your pharmacy.  This is a carefully made plan we use to minimize narcotic use.     Celebrex  - Anti-inflammatory medication taken on a scheduled basis Acetaminophen  - Non-narcotic pain medicine taken on a scheduled basis  Gabapentin  - this is to help with nerve based pain, take on a scheduled basis Oxycodone  - This is a strong narcotic, to be used only on an "as needed" basis for SEVERE pain. Zofran  - take as needed for nausea   FOLLOW-UP If  you develop a Fever (>=101.5), Redness or Drainage from the surgical incision site, please call our office to arrange for an evaluation. Please call the office to schedule a follow-up appointment for your first post-operative appointment, 7-10 days post-operatively.    HELPFUL INFORMATION   You may be more comfortable sleeping in a semi-seated position the first few nights following surgery.  Keep a pillow propped under the elbow and forearm for comfort.  If you have a recliner type of chair it might be beneficial.  If not that is fine too, but it would  be helpful to sleep propped up with pillows behind your operated shoulder as well under your elbow and forearm.  This will reduce pulling on the suture lines.  When dressing, put your operative arm in the sleeve first.  When getting undressed, take your operative arm out last.  Loose fitting, button-down shirts are recommended.  Often in the first days after surgery you may be more comfortable keeping your operative arm under your shirt and not through the sleeve.  You may return to work/school in the next couple of days when you feel up to it.  Desk work and typing in the sling is fine.  We suggest you use the pain medication the first night prior to going to bed, in order to ease any pain when the anesthesia wears off. You should avoid taking pain medications on an empty stomach as it will make you nauseous.  You should wean off your narcotic medicines as soon as you are able.  Most patients will be off narcotics before their first postop appointment.   Do not drink alcoholic beverages or take illicit drugs when taking pain medications.  It is against the law to drive while taking narcotics.  In some states it is against the law to drive while your arm is in a sling.   Pain medication may make you constipated.  Below are a few solutions to try in this order: Decrease the amount of pain medication if you aren't having pain. Drink lots of decaffeinated fluids. Drink prune juice and/or eat dried prunes  If the first 3 don't work start with additional solutions Take Colace - an over-the-counter stool softener Take Senokot - an over-the-counter laxative Take Miralax - a stronger over-the-counter laxative  For more information including helpful videos and documents visit our website:   [email or web address]-[email or web address]     Post Anesthesia Home Care Instructions  Activity: Get plenty of rest for the remainder of the day. A responsible individual must stay with you for  24 hours following the procedure.  For the next 24 hours, DO NOT: -Drive a car -Advertising copywriter -Drink alcoholic beverages -Take any medication unless instructed by your physician -Make any legal decisions or sign important papers.  Meals: Start with liquid foods such as gelatin or soup. Progress to regular foods as tolerated. Avoid greasy, spicy, heavy foods. If nausea and/or vomiting occur, drink only clear liquids until the nausea and/or vomiting subsides. Call your physician if vomiting continues.  Special Instructions/Symptoms: Your throat may feel dry or sore from the anesthesia or the breathing tube placed in your throat during surgery. If this causes discomfort, gargle with warm salt water. The discomfort should disappear within 24 hours.  If you had a scopolamine patch placed behind your ear for the management of post- operative nausea and/or vomiting:  1. The medication in the patch is effective for 72 hours, after which it should be removed.  Wrap patch in a  tissue and discard in the trash. Wash hands thoroughly with soap and water. 2. You may remove the patch earlier than 72 hours if you experience unpleasant side effects which may include dry mouth, dizziness or visual disturbances. 3. Avoid touching the patch. Wash your hands with soap and water after contact with the patch.    Regional Anesthesia Blocks  1. You may not be able to move or feel the blocked extremity after a regional anesthetic block. This may last may last from 3-48 hours after placement, but it will go away. The length of time depends on the medication injected and your individual response to the medication. As the nerves start to wake up, you may experience tingling as the movement and feeling returns to your extremity. If the numbness and inability to move your extremity has not gone away after 48 hours, please call your surgeon.   2. The extremity that is blocked will need to be protected until the  numbness is gone and the strength has returned. Because you cannot feel it, you will need to take extra care to avoid injury. Because it may be weak, you may have difficulty moving it or using it. You may not know what position it is in without looking at it while the block is in effect.  3. For blocks in the legs and feet, returning to weight bearing and walking needs to be done carefully. You will need to wait until the numbness is entirely gone and the strength has returned. You should be able to move your leg and foot normally before you try and bear weight or walk. You will need someone to be with you when you first try to ensure you do not fall and possibly risk injury.  4. Bruising and tenderness at the needle site are common side effects and will resolve in a few days.  5. Persistent numbness or new problems with movement should be communicated to the surgeon or the [facility] [phone] [facility] [phone]).

## 2024-01-01 ENCOUNTER — Ambulatory Visit (HOSPITAL_BASED_OUTPATIENT_CLINIC_OR_DEPARTMENT_OTHER): Admitting: Anesthesiology

## 2024-01-01 ENCOUNTER — Other Ambulatory Visit: Payer: Self-pay

## 2024-01-01 ENCOUNTER — Ambulatory Visit (HOSPITAL_BASED_OUTPATIENT_CLINIC_OR_DEPARTMENT_OTHER)
Admission: RE | Admit: 2024-01-01 | Discharge: 2024-01-01 | Disposition: A | Discharge status: Home / Self Care | Payer: Self-pay | Attending: Orthopaedic Surgery | Admitting: Orthopaedic Surgery

## 2024-01-01 ENCOUNTER — Encounter (HOSPITAL_BASED_OUTPATIENT_CLINIC_OR_DEPARTMENT_OTHER)
Admission: RE | Disposition: A | Discharge status: Home / Self Care | Payer: Self-pay | Source: Home / Self Care | Attending: Orthopaedic Surgery

## 2024-01-01 ENCOUNTER — Encounter (HOSPITAL_BASED_OUTPATIENT_CLINIC_OR_DEPARTMENT_OTHER): Payer: Self-pay | Admitting: Orthopaedic Surgery

## 2024-01-01 DIAGNOSIS — F419 Anxiety disorder, unspecified: Secondary | ICD-10-CM | POA: Insufficient documentation

## 2024-01-01 DIAGNOSIS — I10 Essential (primary) hypertension: Secondary | ICD-10-CM | POA: Insufficient documentation

## 2024-01-01 DIAGNOSIS — K219 Gastro-esophageal reflux disease without esophagitis: Secondary | ICD-10-CM | POA: Diagnosis not present

## 2024-01-01 DIAGNOSIS — G4730 Sleep apnea, unspecified: Secondary | ICD-10-CM | POA: Diagnosis not present

## 2024-01-01 DIAGNOSIS — F32A Depression, unspecified: Secondary | ICD-10-CM | POA: Diagnosis not present

## 2024-01-01 DIAGNOSIS — M19011 Primary osteoarthritis, right shoulder: Secondary | ICD-10-CM | POA: Diagnosis present

## 2024-01-01 DIAGNOSIS — F418 Other specified anxiety disorders: Secondary | ICD-10-CM

## 2024-01-01 DIAGNOSIS — M2410 Other articular cartilage disorders, unspecified site: Secondary | ICD-10-CM | POA: Diagnosis not present

## 2024-01-01 DIAGNOSIS — M7541 Impingement syndrome of right shoulder: Secondary | ICD-10-CM | POA: Diagnosis present

## 2024-01-01 DIAGNOSIS — Z79899 Other long term (current) drug therapy: Secondary | ICD-10-CM | POA: Diagnosis not present

## 2024-01-01 DIAGNOSIS — M7521 Bicipital tendinitis, right shoulder: Secondary | ICD-10-CM | POA: Insufficient documentation

## 2024-01-01 DIAGNOSIS — M24111 Other articular cartilage disorders, right shoulder: Secondary | ICD-10-CM | POA: Diagnosis present

## 2024-01-01 DIAGNOSIS — M75111 Incomplete rotator cuff tear or rupture of right shoulder, not specified as traumatic: Secondary | ICD-10-CM | POA: Diagnosis not present

## 2024-01-01 DIAGNOSIS — S43431A Superior glenoid labrum lesion of right shoulder, initial encounter: Secondary | ICD-10-CM | POA: Diagnosis not present

## 2024-01-01 DIAGNOSIS — X58XXXA Exposure to other specified factors, initial encounter: Secondary | ICD-10-CM | POA: Diagnosis not present

## 2024-01-01 HISTORY — PX: BICEPT TENODESIS: SHX5116

## 2024-01-01 HISTORY — DX: Hyperlipidemia, unspecified: E78.5

## 2024-01-01 HISTORY — DX: Anxiety disorder, unspecified: F41.9

## 2024-01-01 HISTORY — PX: SHOULDER ARTHROSCOPY WITH ROTATOR CUFF REPAIR: SHX5685

## 2024-01-01 HISTORY — PX: SUBACROMIAL DECOMPRESSION: SHX5174

## 2024-01-01 SURGERY — ARTHROSCOPY, SHOULDER, WITH ROTATOR CUFF REPAIR
Anesthesia: Regional | Site: Shoulder | Laterality: Right

## 2024-01-01 MED ORDER — FENTANYL CITRATE (PF) 100 MCG/2ML IJ SOLN
INTRAMUSCULAR | Status: AC
  Filled 2024-01-01: qty 2

## 2024-01-01 MED ORDER — GABAPENTIN 300 MG PO CAPS
ORAL_CAPSULE | ORAL | Status: AC
Start: 2024-01-01 — End: 2024-01-01
  Filled 2024-01-01: qty 1

## 2024-01-01 MED ORDER — ONDANSETRON HCL 4 MG/2ML IJ SOLN
INTRAMUSCULAR | Status: AC
Start: 2024-01-01 — End: 2024-01-01
  Filled 2024-01-01: qty 2

## 2024-01-01 MED ORDER — LIDOCAINE 2% (20 MG/ML) 5 ML SYRINGE
INTRAMUSCULAR | Status: DC | PRN
  Administered 2024-01-01: 100 mg via INTRAVENOUS

## 2024-01-01 MED ORDER — OXYCODONE HCL 5 MG PO TABS: 5.0000 mg | ORAL_TABLET | Freq: Once | ORAL | Status: DC | PRN

## 2024-01-01 MED ORDER — ONDANSETRON HCL 4 MG PO TABS: 4.0000 mg | ORAL_TABLET | Freq: Three times a day (TID) | ORAL | 0 refills | Status: AC | PRN

## 2024-01-01 MED ORDER — MIDAZOLAM HCL 2 MG/2ML IJ SOLN
INTRAMUSCULAR | Status: AC
  Filled 2024-01-01: qty 2

## 2024-01-01 MED ORDER — CEFAZOLIN SODIUM-DEXTROSE 2-4 GM/100ML-% IV SOLN
INTRAVENOUS | Status: AC
  Filled 2024-01-01: qty 100

## 2024-01-01 MED ORDER — GABAPENTIN 100 MG PO CAPS: 100.0000 mg | ORAL_CAPSULE | Freq: Three times a day (TID) | ORAL | 0 refills | Status: AC

## 2024-01-01 MED ORDER — OXYCODONE HCL 5 MG PO TABS: ORAL_TABLET | ORAL | 0 refills | Status: AC

## 2024-01-01 MED ORDER — ACETAMINOPHEN 500 MG PO TABS
ORAL_TABLET | ORAL | Status: AC
  Filled 2024-01-01: qty 2

## 2024-01-01 MED ORDER — OXYCODONE HCL 5 MG PO TABS
ORAL_TABLET | ORAL | 0 refills | Status: DC
  Filled 2024-01-01: qty 30, 4d supply, fill #0

## 2024-01-01 MED ORDER — BUPIVACAINE LIPOSOME 1.3 % IJ SUSP
INTRAMUSCULAR | Status: DC | PRN
  Administered 2024-01-01: 10 mL via PERINEURAL

## 2024-01-01 MED ORDER — PROPOFOL 10 MG/ML IV BOLUS
INTRAVENOUS | Status: AC
  Filled 2024-01-01: qty 20

## 2024-01-01 MED ORDER — ROCURONIUM BROMIDE 10 MG/ML (PF) SYRINGE
PREFILLED_SYRINGE | INTRAVENOUS | Status: AC
Start: 2024-01-01 — End: 2024-01-01
  Filled 2024-01-01: qty 10

## 2024-01-01 MED ORDER — SODIUM CHLORIDE 0.9 % IR SOLN
Status: DC | PRN
  Administered 2024-01-01: 2000 mL

## 2024-01-01 MED ORDER — CELECOXIB 200 MG PO CAPS
200.0000 mg | ORAL_CAPSULE | Freq: Two times a day (BID) | ORAL | 0 refills | Status: DC
Start: 2024-01-01 — End: 2024-01-01
  Filled 2024-01-01: qty 60, 30d supply, fill #0

## 2024-01-01 MED ORDER — AMISULPRIDE (ANTIEMETIC) 5 MG/2ML IV SOLN: 10.0000 mg | Freq: Once | INTRAVENOUS | Status: DC | PRN

## 2024-01-01 MED ORDER — SODIUM CHLORIDE 0.9 % IR SOLN
Status: DC | PRN
  Administered 2024-01-01: 6000 mL

## 2024-01-01 MED ORDER — GABAPENTIN 100 MG PO CAPS
100.0000 mg | ORAL_CAPSULE | Freq: Three times a day (TID) | ORAL | 0 refills | Status: DC
Start: 2024-01-01 — End: 2024-01-01
  Filled 2024-01-01: qty 42, 14d supply, fill #0

## 2024-01-01 MED ORDER — ACETAMINOPHEN 10 MG/ML IV SOLN: 1000.0000 mg | Freq: Once | INTRAVENOUS | Status: DC | PRN

## 2024-01-01 MED ORDER — FENTANYL CITRATE (PF) 100 MCG/2ML IJ SOLN
INTRAMUSCULAR | Status: DC | PRN
  Administered 2024-01-01: 50 ug via INTRAVENOUS

## 2024-01-01 MED ORDER — DEXAMETHASONE SODIUM PHOSPHATE 10 MG/ML IJ SOLN
INTRAMUSCULAR | Status: AC
Start: 2024-01-01 — End: 2024-01-01
  Filled 2024-01-01: qty 1

## 2024-01-01 MED ORDER — ONDANSETRON HCL 4 MG PO TABS
4.0000 mg | ORAL_TABLET | Freq: Three times a day (TID) | ORAL | 0 refills | Status: DC | PRN
  Filled 2024-01-01: qty 10, 4d supply, fill #0

## 2024-01-01 MED ORDER — MIDAZOLAM HCL 2 MG/2ML IJ SOLN
2.0000 mg | Freq: Once | INTRAMUSCULAR | Status: AC
  Administered 2024-01-01: 1 mg via INTRAVENOUS

## 2024-01-01 MED ORDER — ONDANSETRON HCL 4 MG/2ML IJ SOLN
INTRAMUSCULAR | Status: DC | PRN
Start: 2024-01-01 — End: 2024-01-01
  Administered 2024-01-01: 4 mg via INTRAVENOUS

## 2024-01-01 MED ORDER — FENTANYL CITRATE (PF) 100 MCG/2ML IJ SOLN
100.0000 ug | Freq: Once | INTRAMUSCULAR | Status: AC
  Administered 2024-01-01: 50 ug via INTRAVENOUS

## 2024-01-01 MED ORDER — ONDANSETRON HCL 4 MG/2ML IJ SOLN: 4.0000 mg | Freq: Once | INTRAMUSCULAR | Status: DC | PRN

## 2024-01-01 MED ORDER — LACTATED RINGERS IV SOLN: INTRAVENOUS | Status: DC

## 2024-01-01 MED ORDER — BUPIVACAINE HCL (PF) 0.5 % IJ SOLN
INTRAMUSCULAR | Status: DC | PRN
  Administered 2024-01-01: 15 mL via PERINEURAL

## 2024-01-01 MED ORDER — CEFAZOLIN SODIUM-DEXTROSE 2-4 GM/100ML-% IV SOLN
2.0000 g | INTRAVENOUS | Status: AC
  Administered 2024-01-01: 2 g via INTRAVENOUS

## 2024-01-01 MED ORDER — TRANEXAMIC ACID-NACL 1000-0.7 MG/100ML-% IV SOLN
1000.0000 mg | INTRAVENOUS | Status: AC
  Administered 2024-01-01: 1000 mg via INTRAVENOUS

## 2024-01-01 MED ORDER — DEXAMETHASONE SODIUM PHOSPHATE 10 MG/ML IJ SOLN
INTRAMUSCULAR | Status: DC | PRN
  Administered 2024-01-01: 10 mg via INTRAVENOUS

## 2024-01-01 MED ORDER — ROCURONIUM BROMIDE 100 MG/10ML IV SOLN
INTRAVENOUS | Status: DC | PRN
  Administered 2024-01-01: 50 mg via INTRAVENOUS

## 2024-01-01 MED ORDER — PROPOFOL 10 MG/ML IV BOLUS
INTRAVENOUS | Status: DC | PRN
  Administered 2024-01-01: 40 mg via INTRAVENOUS

## 2024-01-01 MED ORDER — SUGAMMADEX SODIUM 200 MG/2ML IV SOLN
INTRAVENOUS | Status: DC | PRN
  Administered 2024-01-01: 350 mg via INTRAVENOUS

## 2024-01-01 MED ORDER — OXYCODONE HCL 5 MG/5ML PO SOLN: 5.0000 mg | Freq: Once | ORAL | Status: DC | PRN

## 2024-01-01 MED ORDER — ACETAMINOPHEN 500 MG PO TABS
1000.0000 mg | ORAL_TABLET | Freq: Once | ORAL | Status: AC
Start: 2024-01-01 — End: 2024-01-01
  Administered 2024-01-01: 1000 mg via ORAL

## 2024-01-01 MED ORDER — CELECOXIB 200 MG PO CAPS: 200.0000 mg | ORAL_CAPSULE | Freq: Two times a day (BID) | ORAL | 0 refills | Status: AC

## 2024-01-01 MED ORDER — PHENYLEPHRINE HCL (PRESSORS) 10 MG/ML IV SOLN
INTRAVENOUS | Status: DC | PRN
  Administered 2024-01-01 (×3): 80 ug via INTRAVENOUS

## 2024-01-01 MED ORDER — LIDOCAINE 2% (20 MG/ML) 5 ML SYRINGE
INTRAMUSCULAR | Status: AC
Start: 2024-01-01 — End: 2024-01-01
  Filled 2024-01-01: qty 5

## 2024-01-01 MED ORDER — PHENYLEPHRINE HCL-NACL 20-0.9 MG/250ML-% IV SOLN
INTRAVENOUS | Status: DC | PRN
  Administered 2024-01-01: 25 ug/min via INTRAVENOUS

## 2024-01-01 MED ORDER — ACETAMINOPHEN 500 MG PO TABS
1000.0000 mg | ORAL_TABLET | Freq: Three times a day (TID) | ORAL | 0 refills | Status: DC
Start: 2024-01-01 — End: 2024-01-01
  Filled 2024-01-01: qty 84, 14d supply, fill #0

## 2024-01-01 MED ORDER — TRANEXAMIC ACID-NACL 1000-0.7 MG/100ML-% IV SOLN
INTRAVENOUS | Status: AC
  Filled 2024-01-01: qty 100

## 2024-01-01 MED ORDER — HYDROMORPHONE HCL 1 MG/ML IJ SOLN: 0.2500 mg | INTRAMUSCULAR | Status: DC | PRN

## 2024-01-01 MED ORDER — ACETAMINOPHEN 500 MG PO TABS: 1000.0000 mg | ORAL_TABLET | Freq: Three times a day (TID) | ORAL | 0 refills | Status: AC

## 2024-01-01 MED ORDER — GABAPENTIN 300 MG PO CAPS
300.0000 mg | ORAL_CAPSULE | Freq: Once | ORAL | Status: AC
  Administered 2024-01-01: 300 mg via ORAL

## 2024-01-01 SURGICAL SUPPLY — 49 items
ANCHOR FBRTK 2.6 SUTURETAP 1.3 (Anchor) IMPLANT
ANCHOR SUT 1.8 FIBERTAK SB KL (Anchor) IMPLANT
ANCHOR SUT BIO SW 4.75X19.1 (Anchor) IMPLANT
BLADE EXCALIBUR 4.0X13 (MISCELLANEOUS) ×2 IMPLANT
BURR OVAL 8 FLU 4.0X13 (MISCELLANEOUS) ×2 IMPLANT
CANNULA 5.75X71 LONG (CANNULA) IMPLANT
CANNULA PASSPORT 5 (CANNULA) IMPLANT
CANNULA PASSPORT BUTTON 10-40 (CANNULA) IMPLANT
CANNULA TWIST IN 8.25X7CM (CANNULA) IMPLANT
CHLORAPREP W/TINT 26 (MISCELLANEOUS) ×2 IMPLANT
CLSR STERI-STRIP ANTIMIC 1/2X4 (GAUZE/BANDAGES/DRESSINGS) ×2 IMPLANT
COOLER ICEMAN CLASSIC (MISCELLANEOUS) ×2 IMPLANT
DRAPE IMP U-DRAPE 54X76 (DRAPES) ×2 IMPLANT
DRAPE INCISE IOBAN 66X45 STRL (DRAPES) IMPLANT
DRAPE SHOULDER BEACH CHAIR (DRAPES) ×2 IMPLANT
DW OUTFLOW CASSETTE/TUBE SET (MISCELLANEOUS) ×2 IMPLANT
GAUZE PAD ABD 8X10 STRL (GAUZE/BANDAGES/DRESSINGS) ×2 IMPLANT
GAUZE SPONGE 4X4 12PLY STRL (GAUZE/BANDAGES/DRESSINGS) ×2 IMPLANT
GLOVE BIO SURGEON STRL SZ 6.5 (GLOVE) ×2 IMPLANT
GLOVE BIOGEL PI IND STRL 6.5 (GLOVE) ×2 IMPLANT
GLOVE BIOGEL PI IND STRL 8 (GLOVE) ×2 IMPLANT
GLOVE ECLIPSE 8.0 STRL XLNG CF (GLOVE) ×2 IMPLANT
GOWN STRL REUS W/ TWL LRG LVL3 (GOWN DISPOSABLE) ×4 IMPLANT
GOWN STRL REUS W/TWL XL LVL3 (GOWN DISPOSABLE) ×2 IMPLANT
KIT STABILIZATION SHOULDER (MISCELLANEOUS) ×2 IMPLANT
KIT STR SPEAR 1.8 FBRTK DISP (KITS) IMPLANT
LASSO 90 CVE QUICKPAS (DISPOSABLE) IMPLANT
LASSO CRESCENT QUICKPASS (SUTURE) IMPLANT
MANIFOLD NEPTUNE II (INSTRUMENTS) ×2 IMPLANT
NDL HD SCORPION MEGA LOADER (NEEDLE) IMPLANT
NDL SAFETY ECLIPSE 18X1.5 (NEEDLE) ×2 IMPLANT
PACK ARTHROSCOPY DSU (CUSTOM PROCEDURE TRAY) ×2 IMPLANT
PACK BASIN DAY SURGERY FS (CUSTOM PROCEDURE TRAY) ×2 IMPLANT
PAD COLD SHLDR WRAP-ON (PAD) ×2 IMPLANT
RESTRAINT HEAD UNIVERSAL NS (MISCELLANEOUS) ×2 IMPLANT
SHEET MEDIUM DRAPE 40X70 STRL (DRAPES) ×2 IMPLANT
SLEEVE SCD COMPRESS KNEE MED (STOCKING) ×2 IMPLANT
SLING SHLDR PAD UNIV <17 (SOFTGOODS) ×2 IMPLANT
SUT MNCRL AB 4-0 PS2 18 (SUTURE) ×2 IMPLANT
SUT PDS AB 0 CT 36 (SUTURE) IMPLANT
SUT TIGER TAPE 7 IN WHITE (SUTURE) IMPLANT
SUTURE FIBERWR #2 38 T-5 BLUE (SUTURE) IMPLANT
SUTURE TAPE TIGERLINK 1.3MM BL (SUTURE) IMPLANT
SYR 5ML LL (SYRINGE) ×2 IMPLANT
TAPE FIBER 2MM 7IN #2 BLUE (SUTURE) IMPLANT
TOWEL GREEN STERILE FF (TOWEL DISPOSABLE) ×4 IMPLANT
TUBE CONNECTING 20X1/4 (TUBING) ×2 IMPLANT
TUBING ARTHROSCOPY IRRIG 16FT (MISCELLANEOUS) ×2 IMPLANT
WAND ABLATOR APOLLO I90 (BUR) ×2 IMPLANT

## 2024-01-01 NOTE — Anesthesia Procedure Notes (Signed)
 Anesthesia Regional Block: Interscalene brachial plexus block   Pre-Anesthetic Checklist: , timeout performed,  Correct Patient, Correct Site, Correct Laterality,  Correct Procedure, Correct Position, site marked,  Risks and benefits discussed,  Surgical consent,  Pre-op evaluation,  At surgeon's request and post-op pain management  Laterality: Upper and Right  Prep: Maximum Sterile Barrier Precautions used, chloraprep       Needles:  Injection technique: Single-shot  Needle Type: Echogenic Needle     Needle Length: 5cm  Needle Gauge: 21     Additional Needles:   Procedures:,,,, ultrasound used (permanent image in chart),,    Narrative:  Start time: 01/01/2024 9:04 AM End time: 01/01/2024 9:10 AM Injection made incrementally with aspirations every 5 mL.  Performed by: Personally  Anesthesiologist: Jefm Garnette LABOR, MD  Additional Notes: Block assessed prior to procedure. Patient tolerated procedure well.

## 2024-01-01 NOTE — Anesthesia Postprocedure Evaluation (Signed)
 Anesthesia Post Note  Patient: Troy Stuart  Procedure(s) Performed: ARTHROSCOPY, SHOULDER, WITH ROTATOR CUFF REPAIR (Right: Shoulder) TENODESIS, BICEPS (Shoulder) DECOMPRESSION, SUBACROMIAL SPACE, DISTAL CLAVICLE EXCISION (Right: Shoulder)     Patient location during evaluation: PACU Anesthesia Type: Regional and General Level of consciousness: awake and alert Pain management: pain level controlled Vital Signs Assessment: post-procedure vital signs reviewed and stable Respiratory status: spontaneous breathing, nonlabored ventilation and respiratory function stable Cardiovascular status: blood pressure returned to baseline Postop Assessment: no apparent nausea or vomiting Anesthetic complications: no   No notable events documented.  Last Vitals:  Vitals:   01/01/24 1120 01/01/24 1130  BP:  (!) 142/89  Pulse:  78  Resp:  15  Temp:    SpO2: 100% 100%    Last Pain:  Vitals:   01/01/24 1130  TempSrc:   PainSc: 0-No pain                 Vertell Row

## 2024-01-01 NOTE — Interval H&P Note (Signed)
 All questions answered, patient wants to proceed with procedure. ? ?

## 2024-01-01 NOTE — Transfer of Care (Signed)
 Immediate Anesthesia Transfer of Care Note  Patient: Va Broadwell  Procedure(s) Performed: ARTHROSCOPY, SHOULDER, WITH ROTATOR CUFF REPAIR (Right: Shoulder) TENODESIS, BICEPS (Shoulder) DECOMPRESSION, SUBACROMIAL SPACE, DISTAL CLAVICLE EXCISION (Right: Shoulder)  Patient Location: PACU  Anesthesia Type:GA combined with regional for post-op pain  Level of Consciousness: drowsy  Airway & Oxygen Therapy: Patient Spontanous Breathing and Patient connected to face mask oxygen  Post-op Assessment: Report given to RN and Post -op Vital signs reviewed and stable  Post vital signs: Reviewed and stable  Last Vitals:  Vitals Value Taken Time  BP 151/90 01/01/24 11:17  Temp    Pulse 78 01/01/24 11:18  Resp 15 01/01/24 11:18  SpO2 99 % 01/01/24 11:18  Vitals shown include unfiled device data.  Last Pain:  Vitals:   01/01/24 0753  TempSrc: Tympanic  PainSc: 5       Patients Stated Pain Goal: 8 (01/01/24 0753)  Complications: No notable events documented.

## 2024-01-01 NOTE — Anesthesia Procedure Notes (Signed)
 Procedure Name: Intubation Date/Time: 01/01/2024 10:09 AM  Performed by: Debarah Chiquita LABOR, CRNAPre-anesthesia Checklist: Patient identified, Emergency Drugs available, Suction available and Patient being monitored Patient Re-evaluated:Patient Re-evaluated prior to induction Oxygen Delivery Method: Circle system utilized Preoxygenation: Pre-oxygenation with 100% oxygen Induction Type: IV induction Ventilation: Mask ventilation without difficulty Laryngoscope Size: Mac and 4 Grade View: Grade I Tube type: Oral Tube size: 7.5 mm Number of attempts: 1 Airway Equipment and Method: Stylet and Bite block Placement Confirmation: ETT inserted through vocal cords under direct vision, positive ETCO2 and breath sounds checked- equal and bilateral Secured at: 22 cm Tube secured with: Tape Dental Injury: Teeth and Oropharynx as per pre-operative assessment

## 2024-01-01 NOTE — Op Note (Signed)
 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  POST-OPERATIVE DIAGNOSIS: same  PROCEDURE: Arthroscopic extensive debridement - 29823 Subdeltoid Bursa, Supraspinatus Tendon, Anterior Labrum, Superior Labrum, Posterior Labrum, and glenoid bone glenoid cartilage, humeral glenohumeral cartilage Arthroscopic distal clavicle excision - 70175 Arthroscopic subacromial decompression - 70173 Arthroscopic rotator cuff repair - 70172 Arthroscopic biceps tenodesis - 70171   OPERATIVE FINDING: Exam under anesthesia: Normal Articular space: Anterior posterior labral tearing debrided back to stable base Chondral surfaces: Normal Biceps: Type II SLAP tear Subscapularis: Intact  Supraspinatus: Incomplete tear high-grade partial-thickness leading edge supraspinatus tear, we were able to identify it on the bursal side take it down and repair it.  Only about 10 to 15% of fibers remaining.  One by one repair performed. Infraspinatus: Intact      Post-operative plan: The patient will be non-weightbearing in a sling for 6 weeks.  The patient will be discharged home.  DVT prophylaxis not indicated in ambulatory upper extremity patient without known risk factors.   Pain control with PRN pain medication preferring oral medicines.  Follow up plan will be scheduled in approximately 7 days for incision check and XR.  Surgeons:Primary: Cristy Bonner DASEN, MD Assistants:Caroline McBane, PA-C Location: [facility] OR ROOM 6 Anesthesia: General with Exparel  interscalene block Antibiotics: Ancef  2 g Tourniquet time: None Estimated Blood Loss: Minimal Complications: None Specimens: None Implants: Implant Name Type Inv. Item Serial No. Manufacturer Lot No. LRB No. Used Action  ANCHOR SUT 1.8 FIBERTAK SB KL - [id number]  Anchor ANCHOR SUT 1.8 FIBERTAK SB KL  [employer] [id number] Right 1 Implanted  ANCHOR SUT 1.8 FIBERTAK SB KL - [id number] Anchor ANCHOR SUT 1.8 FIBERTAK SB KL  [employer] [id number] Right 1 Implanted  ANCHOR FBRTK 2.6 SUTURETAP 1.3 - [id number] Anchor ANCHOR FBRTK 2.6 SUTURETAP 1.3  [employer] [id number] Right 1 Implanted  ANCHOR SUT BIO SW 4.75X19.1 - [id number] Anchor ANCHOR SUT BIO SW 4.75X19.1  [employer] [id number] Right 1 Implanted    Indications for Surgery:   Troy Stuart is a 58 y.o. male with continued shoulder pain refractory to nonoperative measures for extended period of time.    The risks and benefits were explained at length including but not limited to continued pain, cuff failure, biceps tenodesis failure, stiffness, need for further surgery and infection.   Procedure:   Patient was correctly identified in the preoperative holding area and operative site marked.  Patient brought to OR and positioned beachchair on an Helen table ensuring that all bony prominences were padded and the head was in an appropriate location.  Anesthesia was induced and the operative shoulder was prepped and draped in the usual sterile fashion.  Timeout was called preincision.  A standard posterior viewing portal was made after localizing the portal with a spinal needle.  An anterior accessory portal was also made.  After clearing the articular space the camera was positioned in the subacromial space.  Findings above.    Extensive debridement was performed of the anterior interval tissue, labral fraying and the bursa.  Glenoid bone, glenoid cartilage, humeral bone were all debrided.  Subacromial decompression: We made a lateral portal with spinal needle guidance. We then proceeded to debride bursal tissue extensively with a shaver and arthrocare device. At that point we continued to identify the borders of the acromion and identify the spur. We then  carefully preserved the deltoid fascia and used a burr  to convert the acromion to a Type 1 flat acromion without issue.  Distal Clavicle resection:  The scope was placed in the subacromial space from the posterior portal.  A hemostat was placed through the anterior portal and we spread at the [facility] joint.  A burr was then inserted and 10 mm of distal clavicle was resected taking care to avoid damage to the capsule around the joint and avoiding overhanging bone posteriorly.    Biceps tenodesis: We marked the tendon and then performed a tenotomy and debridement of the stump in the articular space. We then identified the biceps tendon in its groove suprapec with the arthroscope in the lateral portal taking care to move from lateral to medial to avoid injury to the subscapularis. At that point we unroofed the tendon itself and mobilized it. An accessory anterior portal was made in line with the tendon and we grasped it from the anterior superior portal and worked from the accessory anterior portal. Two [employer] 1.77mm knotless anchors were placed in the groove and the tendon was secured in a luggage loop style fashion with a pass of the limb of suture through the tendon using a scorpion device to avoid pull-through.  Repair was completed with good tension on the tendon.  Residual stump of the tendon was removed after being resected with a RF ablator.  Rotator cuff repair: We identified the tear, it was debrided back to stable edges, we repaired the tuberosity for healing.  We placed a 2.6 fiber tack anchor at the medial articular margin.  We passed each of these limbs for a total of 4 with a scorpion.  These were brought over to a 4.75 bio composite swivel lock placed 8 to 10 mm for below the tuberosity.  Good compression of tissue to bone was noted.  The incisions were closed with absorbable monocryl and steri strips.  A sterile dressing was placed along with a sling. The patient was awoken from general anesthesia and taken to the PACU in stable condition without  complication.   Aleck Stalling, PA-C, present and scrubbed throughout the case, critical for completion in a timely fashion, and for retraction, instrumentation, closure.

## 2024-01-01 NOTE — Progress Notes (Signed)
Assisted Dr. Valma Cava with right, interscalene , ultrasound guided block. Side rails up, monitors on throughout procedure. See vital signs in flow sheet. Tolerated Procedure well.

## 2024-01-02 ENCOUNTER — Encounter (HOSPITAL_BASED_OUTPATIENT_CLINIC_OR_DEPARTMENT_OTHER): Payer: Self-pay | Admitting: Orthopaedic Surgery
